# Patient Record
Sex: Female | Born: 1980 | Race: White | Hispanic: No | Marital: Single | Smoking: Current every day smoker
Health system: Southern US, Community
[De-identification: ages and names within clinical notes are randomized; demographics above are authoritative.]

## PROBLEM LIST (undated history)

## (undated) ENCOUNTER — Inpatient Hospital Stay (HOSPITAL_COMMUNITY): Payer: Self-pay

## (undated) DIAGNOSIS — G935 Compression of brain: Secondary | ICD-10-CM

## (undated) DIAGNOSIS — J45909 Unspecified asthma, uncomplicated: Secondary | ICD-10-CM

## (undated) DIAGNOSIS — J302 Other seasonal allergic rhinitis: Secondary | ICD-10-CM

## (undated) DIAGNOSIS — A609 Anogenital herpesviral infection, unspecified: Secondary | ICD-10-CM

## (undated) DIAGNOSIS — F419 Anxiety disorder, unspecified: Secondary | ICD-10-CM

## (undated) DIAGNOSIS — R002 Palpitations: Secondary | ICD-10-CM

## (undated) DIAGNOSIS — G8929 Other chronic pain: Secondary | ICD-10-CM

## (undated) DIAGNOSIS — R87612 Low grade squamous intraepithelial lesion on cytologic smear of cervix (LGSIL): Secondary | ICD-10-CM

## (undated) DIAGNOSIS — M545 Low back pain, unspecified: Secondary | ICD-10-CM

## (undated) DIAGNOSIS — M1990 Unspecified osteoarthritis, unspecified site: Secondary | ICD-10-CM

## (undated) DIAGNOSIS — G629 Polyneuropathy, unspecified: Secondary | ICD-10-CM

## (undated) HISTORY — DX: Compression of brain: G93.5

## (undated) HISTORY — DX: Anogenital herpesviral infection, unspecified: A60.9

## (undated) HISTORY — PX: WISDOM TOOTH EXTRACTION: SHX21

## (undated) HISTORY — DX: Low grade squamous intraepithelial lesion on cytologic smear of cervix (LGSIL): R87.612

## (undated) HISTORY — PX: HERNIA REPAIR: SHX51

## (undated) HISTORY — PX: WRIST GANGLION EXCISION: SUR520

## (undated) HISTORY — DX: Palpitations: R00.2

---
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
## 1998-11-08 ENCOUNTER — Other Ambulatory Visit: Admission: RE | Admit: 1998-11-08 | Discharge: 1998-11-08 | Payer: Self-pay | Admitting: Gynecology

## 1999-11-08 ENCOUNTER — Other Ambulatory Visit: Admission: RE | Admit: 1999-11-08 | Discharge: 1999-11-08 | Payer: Self-pay | Admitting: Gynecology

## 1999-12-21 ENCOUNTER — Encounter (INDEPENDENT_AMBULATORY_CARE_PROVIDER_SITE_OTHER): Payer: Self-pay

## 1999-12-21 ENCOUNTER — Other Ambulatory Visit: Admission: RE | Admit: 1999-12-21 | Discharge: 1999-12-21 | Payer: Self-pay | Admitting: Gynecology

## 2000-07-29 ENCOUNTER — Other Ambulatory Visit: Admission: RE | Admit: 2000-07-29 | Discharge: 2000-07-29 | Payer: Self-pay | Admitting: Obstetrics and Gynecology

## 2000-12-21 ENCOUNTER — Inpatient Hospital Stay (HOSPITAL_COMMUNITY): Admission: AD | Admit: 2000-12-21 | Discharge: 2000-12-21 | Payer: Self-pay | Admitting: Gynecology

## 2001-07-02 ENCOUNTER — Other Ambulatory Visit: Admission: RE | Admit: 2001-07-02 | Discharge: 2001-07-02 | Payer: Self-pay | Admitting: Gynecology

## 2001-12-15 ENCOUNTER — Other Ambulatory Visit: Admission: RE | Admit: 2001-12-15 | Discharge: 2001-12-15 | Payer: Self-pay | Admitting: Gynecology

## 2003-04-01 ENCOUNTER — Emergency Department (HOSPITAL_COMMUNITY): Admission: EM | Admit: 2003-04-01 | Discharge: 2003-04-01 | Payer: Self-pay

## 2003-04-09 ENCOUNTER — Emergency Department (HOSPITAL_COMMUNITY): Admission: AD | Admit: 2003-04-09 | Discharge: 2003-04-09 | Payer: Self-pay | Admitting: Family Medicine

## 2004-07-25 ENCOUNTER — Emergency Department (HOSPITAL_COMMUNITY): Admission: EM | Admit: 2004-07-25 | Discharge: 2004-07-25 | Payer: Self-pay | Admitting: Emergency Medicine

## 2004-11-13 ENCOUNTER — Inpatient Hospital Stay (HOSPITAL_COMMUNITY): Admission: AD | Admit: 2004-11-13 | Discharge: 2004-11-13 | Payer: Self-pay | Admitting: Obstetrics and Gynecology

## 2004-11-14 ENCOUNTER — Emergency Department (HOSPITAL_COMMUNITY): Admission: EM | Admit: 2004-11-14 | Discharge: 2004-11-14 | Payer: Self-pay | Admitting: Family Medicine

## 2005-06-22 ENCOUNTER — Emergency Department (HOSPITAL_COMMUNITY): Admission: EM | Admit: 2005-06-22 | Discharge: 2005-06-22 | Payer: Self-pay | Admitting: Family Medicine

## 2005-08-20 ENCOUNTER — Emergency Department (HOSPITAL_COMMUNITY): Admission: EM | Admit: 2005-08-20 | Discharge: 2005-08-20 | Payer: Self-pay | Admitting: Family Medicine

## 2005-09-20 ENCOUNTER — Ambulatory Visit (HOSPITAL_COMMUNITY): Admission: RE | Admit: 2005-09-20 | Discharge: 2005-09-20 | Payer: Self-pay | Admitting: Orthopedic Surgery

## 2006-04-05 ENCOUNTER — Inpatient Hospital Stay (HOSPITAL_COMMUNITY): Admission: AD | Admit: 2006-04-05 | Discharge: 2006-04-05 | Payer: Self-pay | Admitting: Obstetrics

## 2006-07-11 ENCOUNTER — Emergency Department (HOSPITAL_COMMUNITY): Admission: EM | Admit: 2006-07-11 | Discharge: 2006-07-11 | Payer: Self-pay | Admitting: Emergency Medicine

## 2006-12-08 ENCOUNTER — Emergency Department (HOSPITAL_COMMUNITY): Admission: EM | Admit: 2006-12-08 | Discharge: 2006-12-09 | Payer: Self-pay | Admitting: Emergency Medicine

## 2006-12-08 ENCOUNTER — Encounter: Admission: RE | Admit: 2006-12-08 | Discharge: 2006-12-08 | Payer: Self-pay | Admitting: Orthopaedic Surgery

## 2006-12-24 ENCOUNTER — Emergency Department (HOSPITAL_COMMUNITY): Admission: EM | Admit: 2006-12-24 | Discharge: 2006-12-24 | Payer: Self-pay | Admitting: Emergency Medicine

## 2007-01-28 ENCOUNTER — Encounter: Admission: RE | Admit: 2007-01-28 | Discharge: 2007-01-28 | Payer: Self-pay | Admitting: Orthopaedic Surgery

## 2007-03-25 ENCOUNTER — Encounter
Admission: RE | Admit: 2007-03-25 | Discharge: 2007-04-24 | Payer: Self-pay | Admitting: Physical Medicine & Rehabilitation

## 2007-03-25 ENCOUNTER — Ambulatory Visit: Payer: Self-pay | Admitting: Physical Medicine & Rehabilitation

## 2007-05-01 ENCOUNTER — Ambulatory Visit (HOSPITAL_COMMUNITY): Admission: RE | Admit: 2007-05-01 | Discharge: 2007-05-01 | Payer: Self-pay | Admitting: Obstetrics

## 2007-07-01 ENCOUNTER — Encounter
Admission: RE | Admit: 2007-07-01 | Discharge: 2007-09-29 | Payer: Self-pay | Admitting: Physical Medicine & Rehabilitation

## 2007-07-03 ENCOUNTER — Ambulatory Visit: Payer: Self-pay | Admitting: Physical Medicine & Rehabilitation

## 2007-08-31 ENCOUNTER — Ambulatory Visit: Payer: Self-pay | Admitting: Physical Medicine & Rehabilitation

## 2007-12-04 ENCOUNTER — Encounter
Admission: RE | Admit: 2007-12-04 | Discharge: 2007-12-07 | Payer: Self-pay | Admitting: Physical Medicine & Rehabilitation

## 2007-12-07 ENCOUNTER — Ambulatory Visit: Payer: Self-pay | Admitting: Physical Medicine & Rehabilitation

## 2008-02-29 ENCOUNTER — Encounter
Admission: RE | Admit: 2008-02-29 | Discharge: 2008-02-29 | Payer: Self-pay | Admitting: Physical Medicine & Rehabilitation

## 2008-02-29 ENCOUNTER — Ambulatory Visit: Payer: Self-pay | Admitting: Physical Medicine & Rehabilitation

## 2008-03-24 ENCOUNTER — Inpatient Hospital Stay (HOSPITAL_COMMUNITY): Admission: AD | Admit: 2008-03-24 | Discharge: 2008-03-24 | Payer: Self-pay | Admitting: Obstetrics & Gynecology

## 2008-03-26 ENCOUNTER — Inpatient Hospital Stay (HOSPITAL_COMMUNITY): Admission: AD | Admit: 2008-03-26 | Discharge: 2008-03-26 | Payer: Self-pay | Admitting: Obstetrics & Gynecology

## 2008-04-04 ENCOUNTER — Inpatient Hospital Stay (HOSPITAL_COMMUNITY): Admission: RE | Admit: 2008-04-04 | Discharge: 2008-04-04 | Payer: Self-pay | Admitting: Obstetrics & Gynecology

## 2008-05-17 IMAGING — US US TRANSVAGINAL NON-OB
1 series · 14 of 25 positions shown · non-contrast
Comparison: none

CLINICAL DATA: Right pelvic pain.
 TRANSABDOMINAL AND TRANSVAGINAL PELVIC ULTRASOUND:
TECHNIQUE: Both transabdominal and transvaginal ultrasound examinations of the pelvis were performed including evaluation of the uterus, ovaries, adnexal regions, and pelvic cul-de-sac.

[Series 1: us transvaginal non-ob · 0.25mm/px · 14 of 43 slices shown]
[im 1/43]
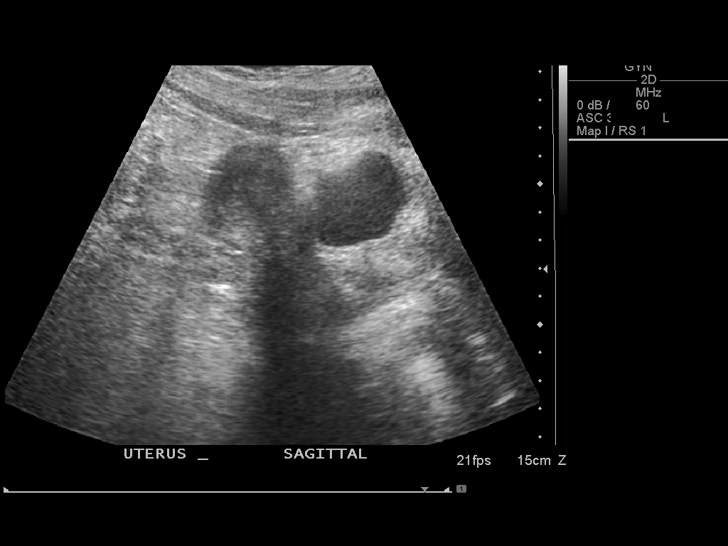
[im 4/43]
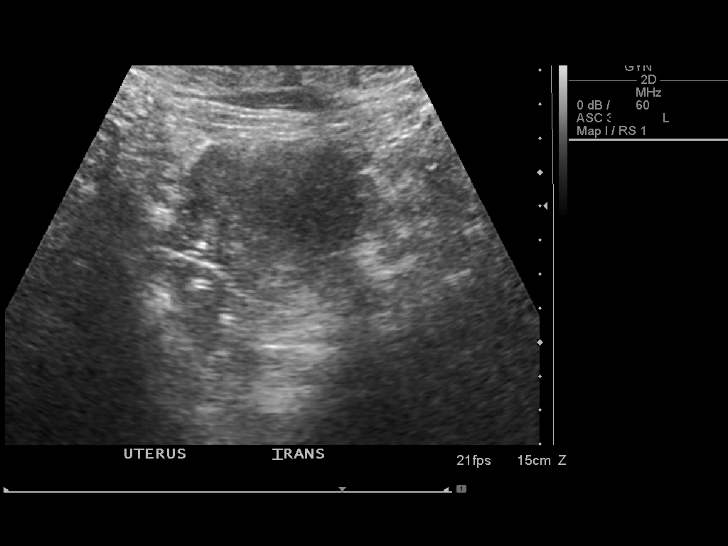
[im 8/43]
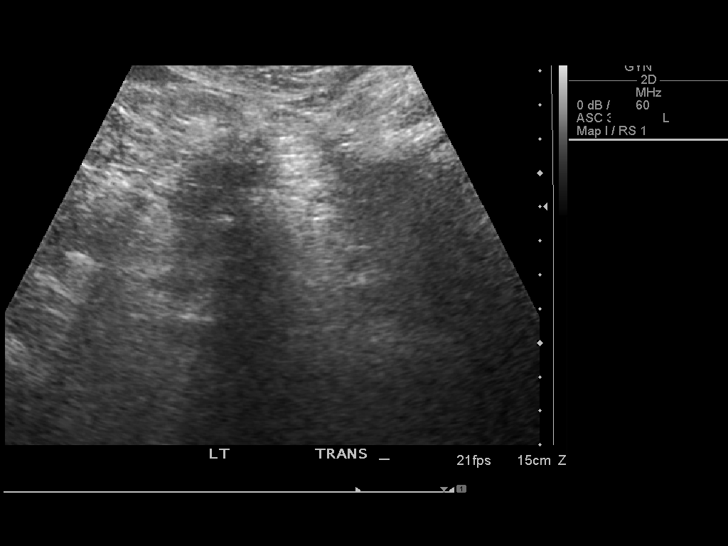
[im 11/43]
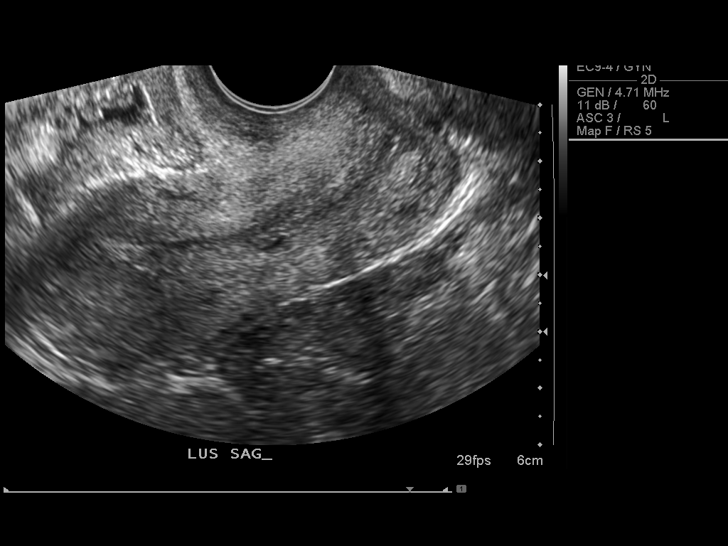
[im 15/43]
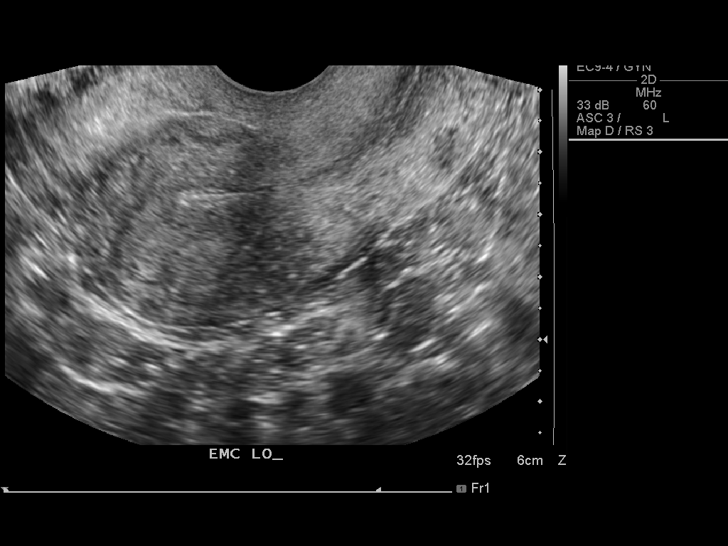
[im 16/43]
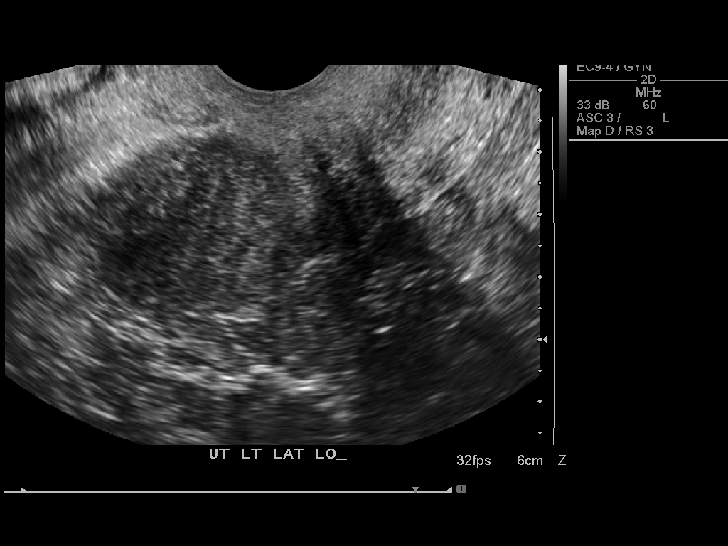
[im 20/43]
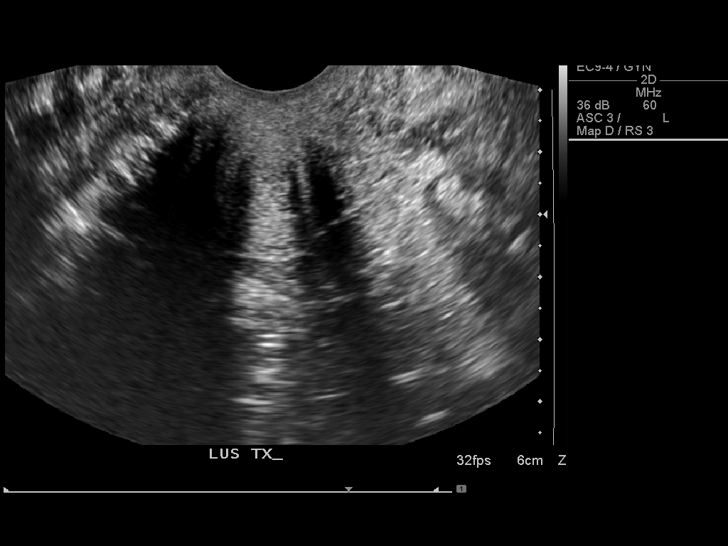
[im 23/43]
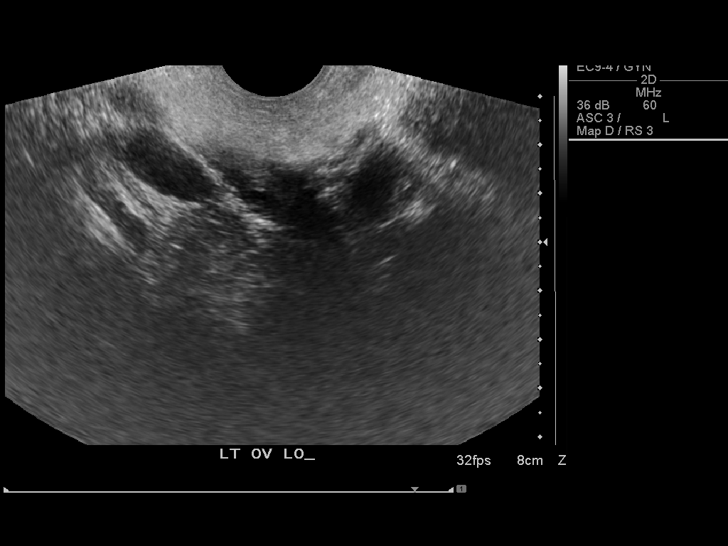
[im 27/43]
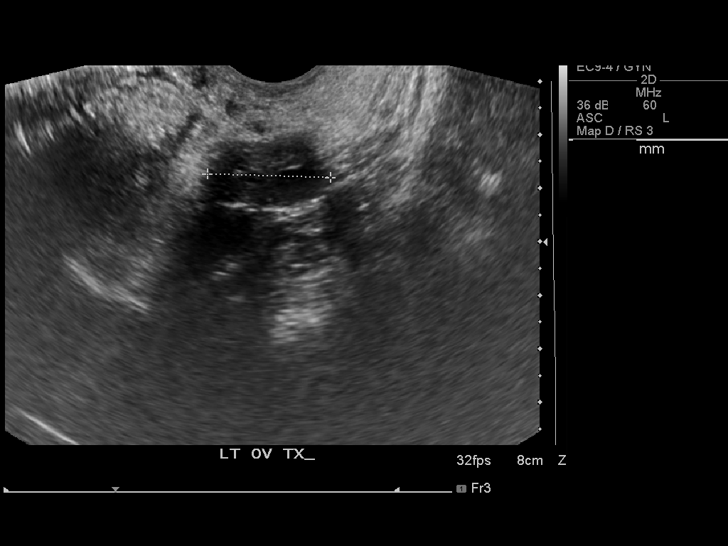
[im 29/43]
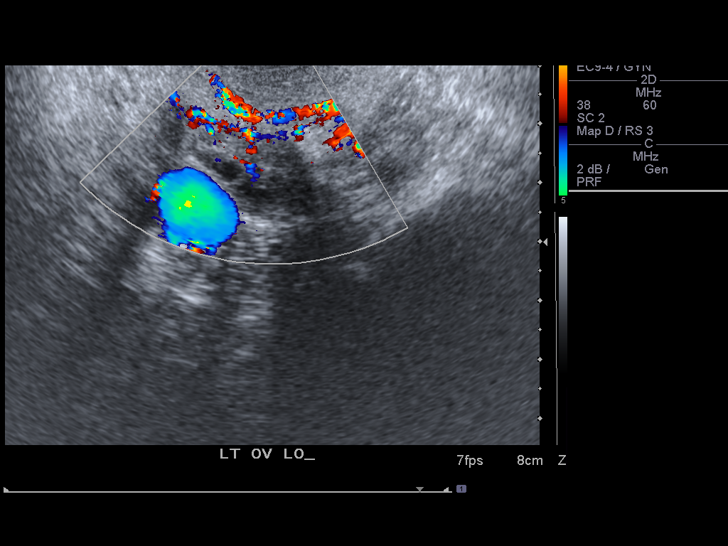
[im 32/43]
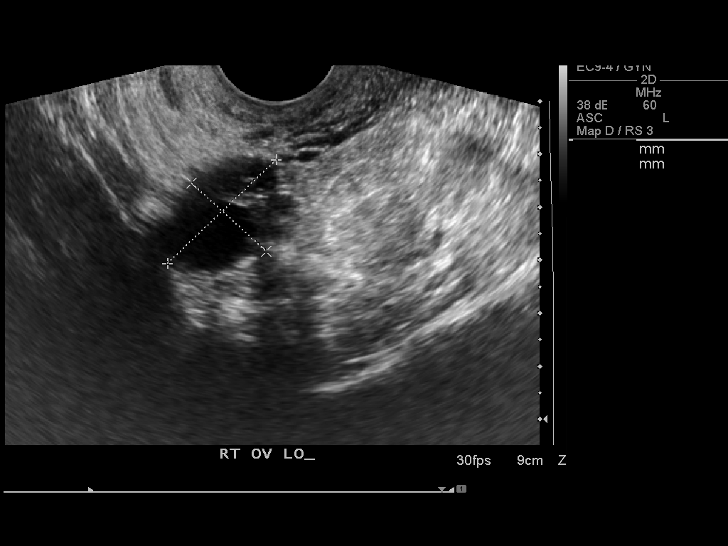
[im 36/43]
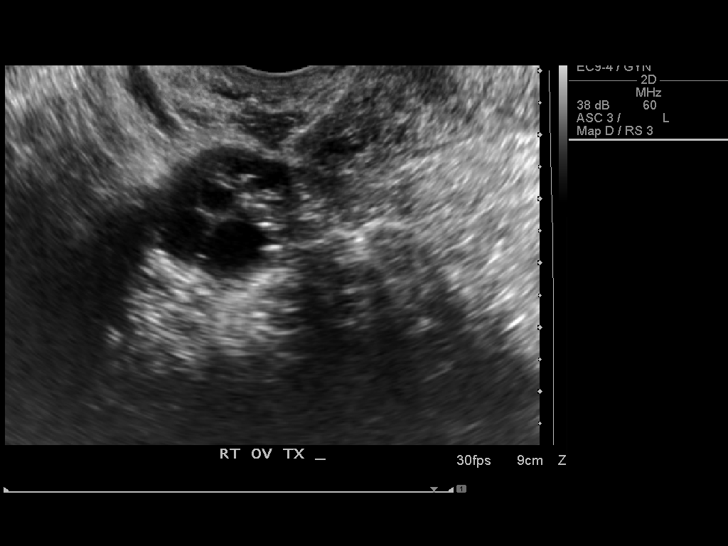
[im 39/43]
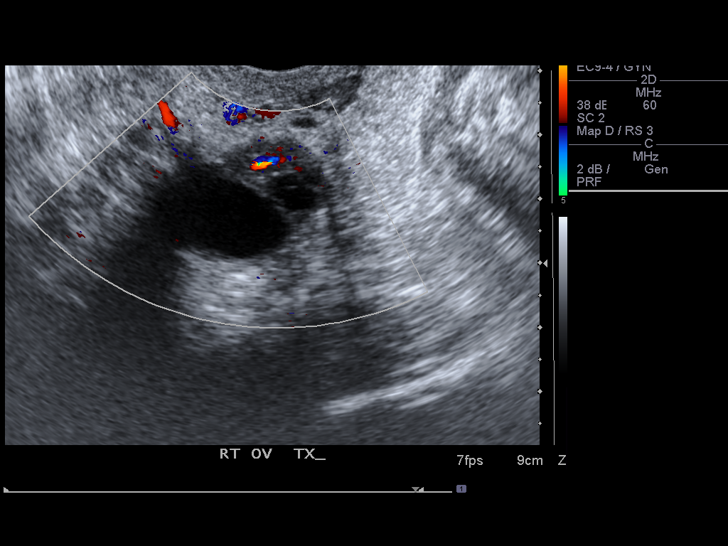
[im 43/43]
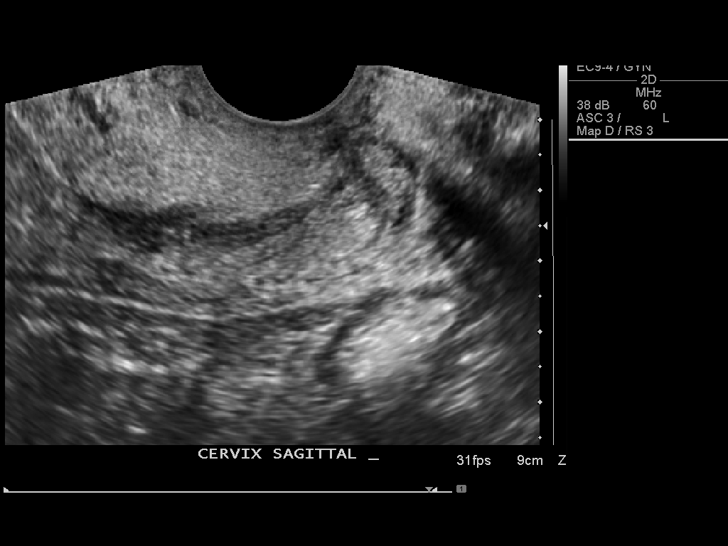

[14 of 25 positions shown; findings below may reference images not displayed]

FINDINGS: The uterus is within normal limits in size and appearance.  No uterine masses are identified.  The endometrium is within normal limits in thickness.
 Both ovaries are normal in appearance, and no masses or abnormal cystic lesions are seen.  No other adnexal abnormalities are identified, and there is no evidence of free fluid.
IMPRESSION: Normal study.  No evidence of pelvic mass or free fluid.

## 2008-06-13 ENCOUNTER — Encounter: Admission: RE | Admit: 2008-06-13 | Discharge: 2008-09-11 | Payer: Self-pay | Admitting: Anesthesiology

## 2008-06-27 ENCOUNTER — Ambulatory Visit (HOSPITAL_COMMUNITY): Admission: RE | Admit: 2008-06-27 | Discharge: 2008-06-27 | Payer: Self-pay | Admitting: Family Medicine

## 2008-08-11 ENCOUNTER — Ambulatory Visit: Payer: Self-pay | Admitting: Obstetrics & Gynecology

## 2008-08-11 ENCOUNTER — Encounter: Payer: Self-pay | Admitting: Family

## 2008-08-12 ENCOUNTER — Encounter: Payer: Self-pay | Admitting: Family

## 2008-08-12 LAB — CONVERTED CEMR LAB
Clue Cells Wet Prep HPF POC: NONE SEEN
Trich, Wet Prep: NONE SEEN
WBC, Wet Prep HPF POC: NONE SEEN
Yeast Wet Prep HPF POC: NONE SEEN

## 2008-08-25 ENCOUNTER — Ambulatory Visit: Payer: Self-pay | Admitting: Obstetrics & Gynecology

## 2008-09-08 ENCOUNTER — Ambulatory Visit: Payer: Self-pay | Admitting: Obstetrics & Gynecology

## 2008-09-08 LAB — CONVERTED CEMR LAB
Trich, Wet Prep: NONE SEEN
Yeast Wet Prep HPF POC: NONE SEEN

## 2008-09-22 ENCOUNTER — Ambulatory Visit: Payer: Self-pay | Admitting: Obstetrics & Gynecology

## 2008-09-22 LAB — CONVERTED CEMR LAB
HCT: 34.7 % — ABNORMAL LOW (ref 36.0–46.0)
Hemoglobin: 11.1 g/dL — ABNORMAL LOW (ref 12.0–15.0)
MCHC: 32 g/dL (ref 30.0–36.0)
MCV: 86.3 fL (ref 78.0–100.0)
Platelets: 304 10*3/uL (ref 150–400)
RDW: 14.6 % (ref 11.5–15.5)

## 2008-10-06 ENCOUNTER — Ambulatory Visit: Payer: Self-pay | Admitting: Obstetrics & Gynecology

## 2008-10-20 ENCOUNTER — Ambulatory Visit: Payer: Self-pay | Admitting: Obstetrics & Gynecology

## 2008-11-03 ENCOUNTER — Ambulatory Visit: Payer: Self-pay | Admitting: Obstetrics & Gynecology

## 2008-11-03 LAB — CONVERTED CEMR LAB
Chlamydia, DNA Probe: NEGATIVE
GC Probe Amp, Genital: NEGATIVE

## 2008-11-04 ENCOUNTER — Encounter: Payer: Self-pay | Admitting: Obstetrics & Gynecology

## 2008-11-04 LAB — CONVERTED CEMR LAB: Yeast Wet Prep HPF POC: NONE SEEN

## 2008-11-10 ENCOUNTER — Ambulatory Visit: Payer: Self-pay | Admitting: Family Medicine

## 2008-11-17 ENCOUNTER — Ambulatory Visit: Payer: Self-pay | Admitting: Obstetrics & Gynecology

## 2008-11-24 ENCOUNTER — Ambulatory Visit: Payer: Self-pay | Admitting: Obstetrics & Gynecology

## 2008-11-30 ENCOUNTER — Inpatient Hospital Stay (HOSPITAL_COMMUNITY): Admission: AD | Admit: 2008-11-30 | Discharge: 2008-12-01 | Payer: Self-pay | Admitting: Obstetrics & Gynecology

## 2008-11-30 ENCOUNTER — Ambulatory Visit: Payer: Self-pay | Admitting: Physician Assistant

## 2008-12-01 ENCOUNTER — Ambulatory Visit: Payer: Self-pay | Admitting: Obstetrics & Gynecology

## 2008-12-03 ENCOUNTER — Inpatient Hospital Stay (HOSPITAL_COMMUNITY): Admission: AD | Admit: 2008-12-03 | Discharge: 2008-12-05 | Payer: Self-pay | Admitting: Obstetrics & Gynecology

## 2008-12-03 ENCOUNTER — Ambulatory Visit: Payer: Self-pay | Admitting: Advanced Practice Midwife

## 2009-04-21 IMAGING — US US OB TRANSVAGINAL
1 series · 14 of 21 positions shown · non-contrast
Comparison: none

OBSTETRICAL ULTRASOUND:
 This ultrasound exam was performed in the [HOSPITAL] Ultrasound Department.  The OB US report was generated in the AS system, and faxed to the ordering physician.  This report is also available in [REDACTED] PACS.

[Series 1: us ob transvaginal · 14 of 21 slices shown]
[im 1/21]
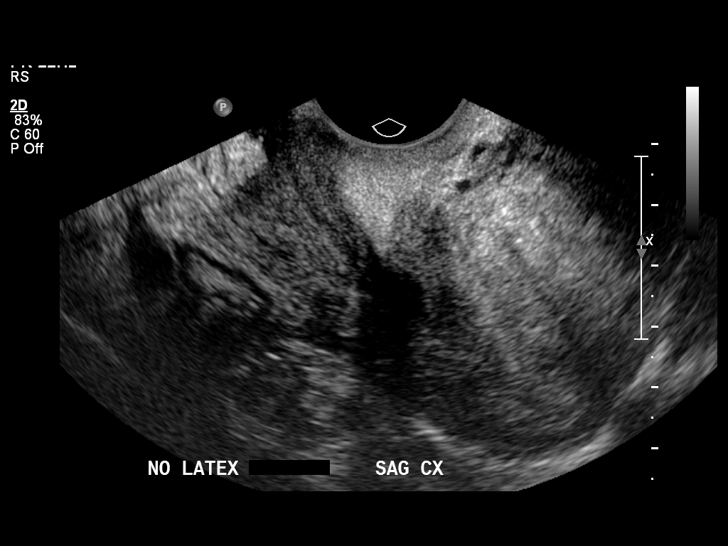
[im 3/21]
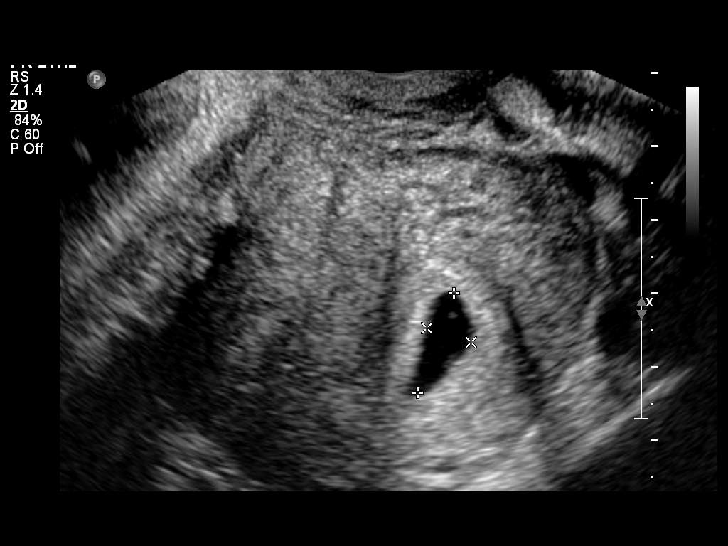
[im 4/21]
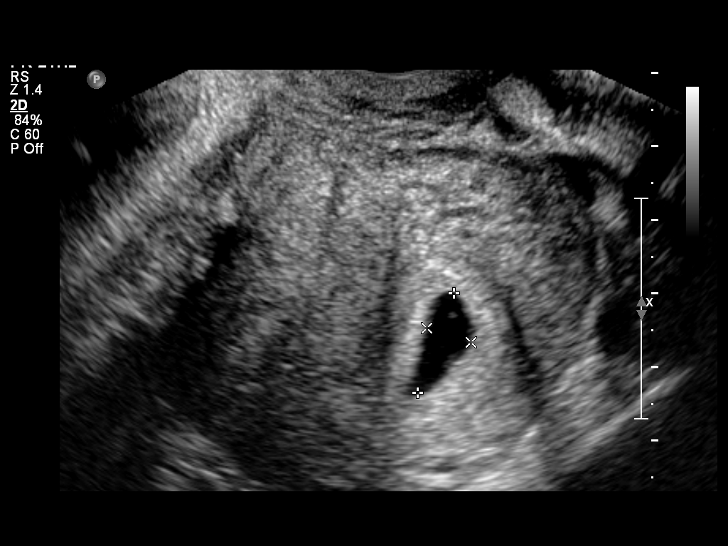
[im 6/21]
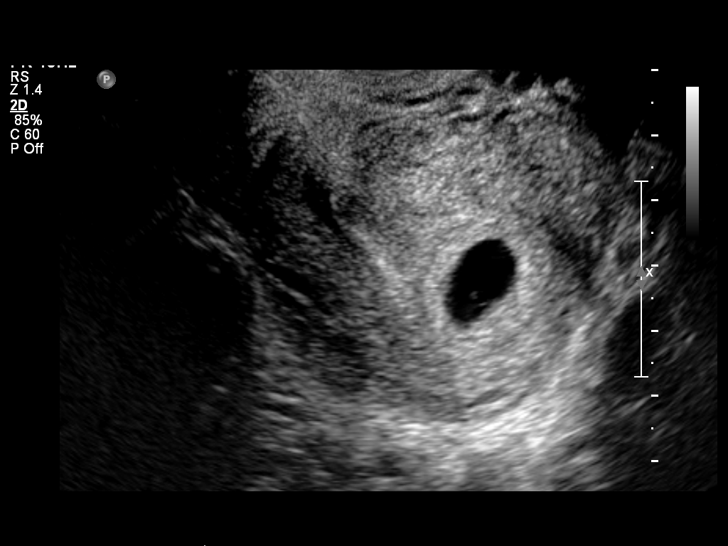
[im 7/21]
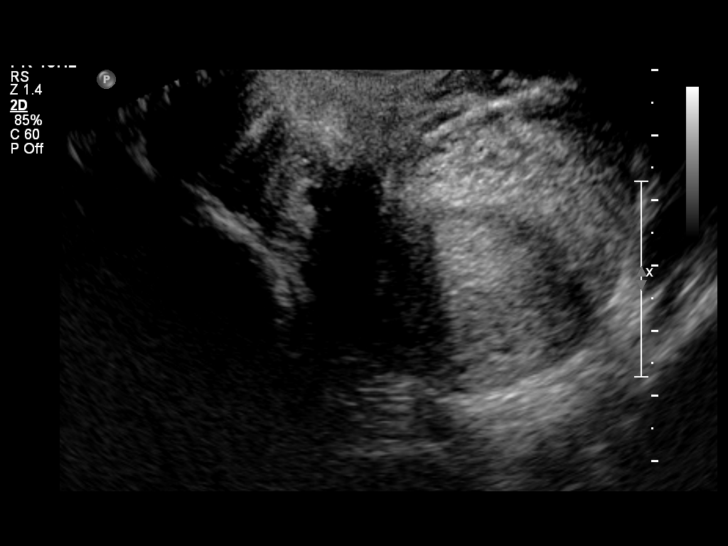
[im 9/21]
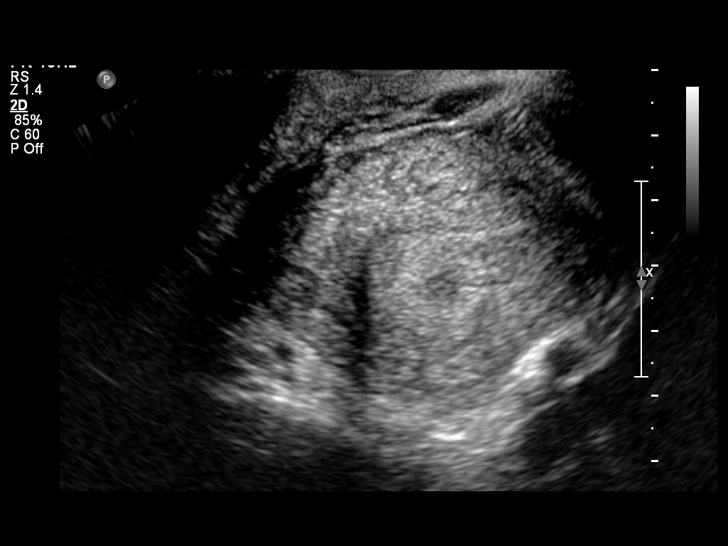
[im 10/21]
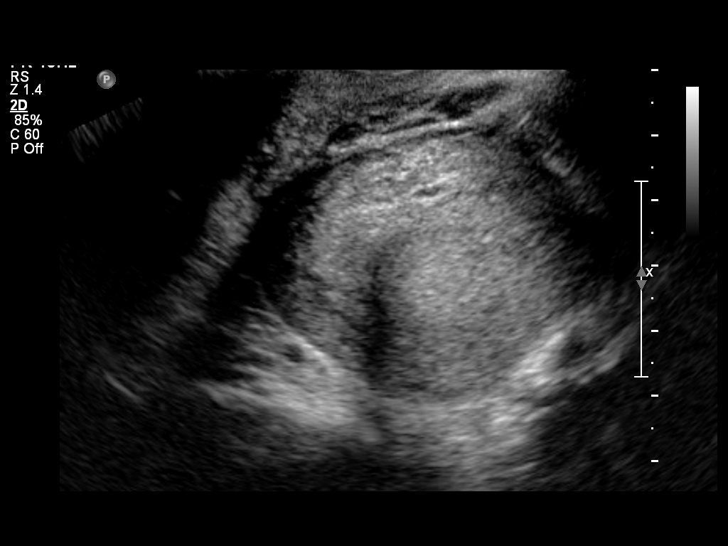
[im 12/21]
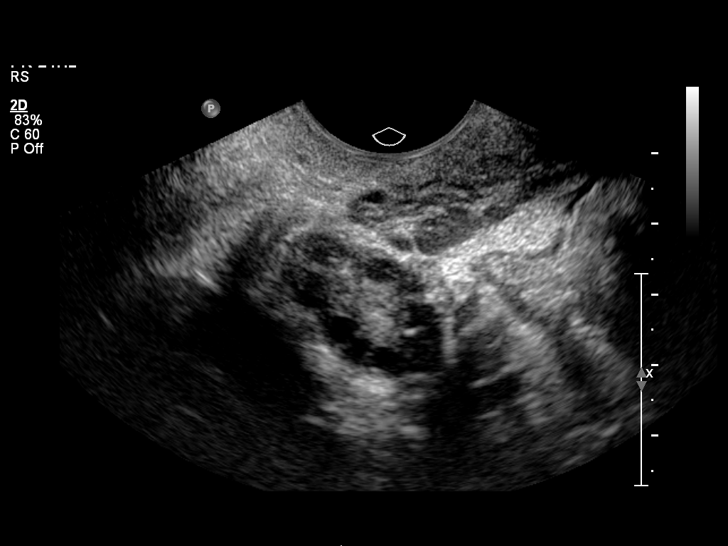
[im 13/21]
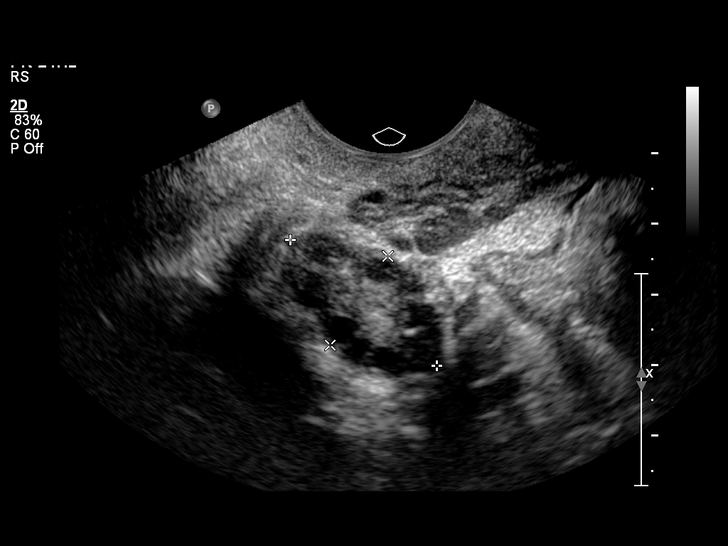
[im 15/21]
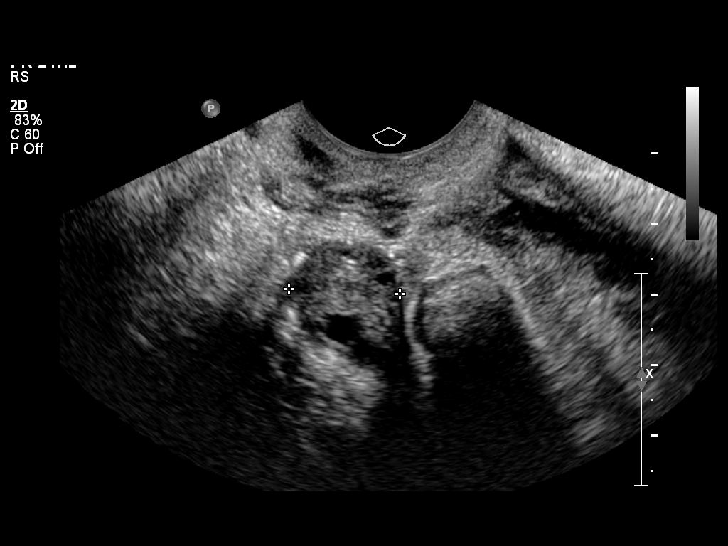
[im 16/21]
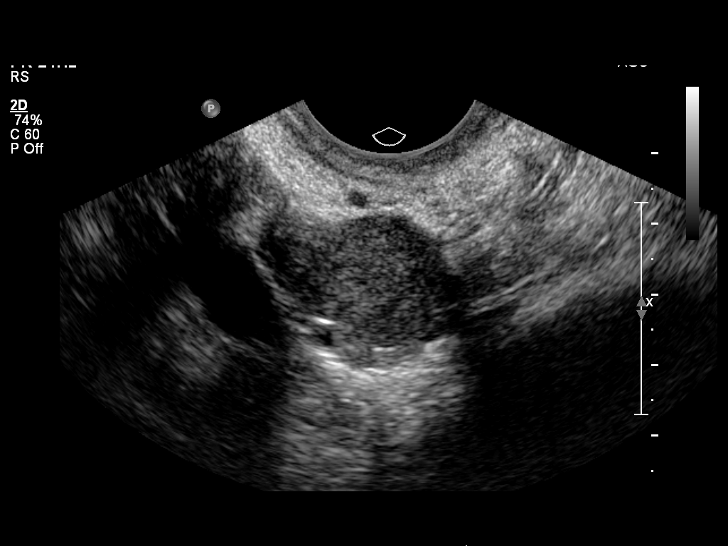
[im 18/21]
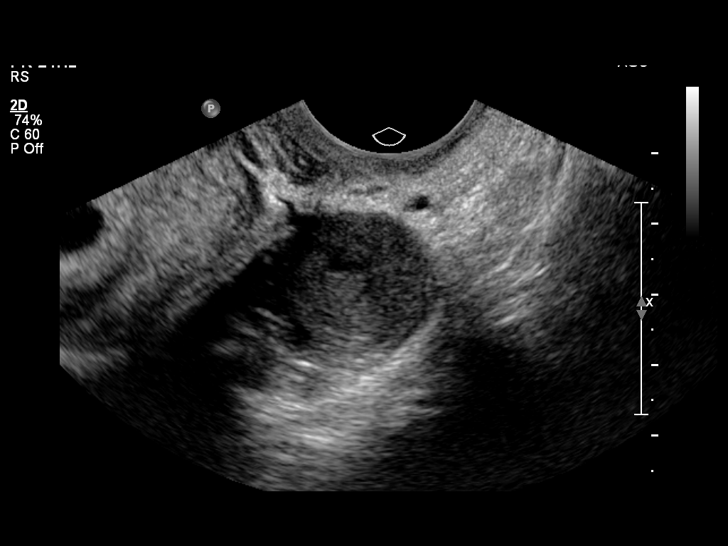
[im 19/21]
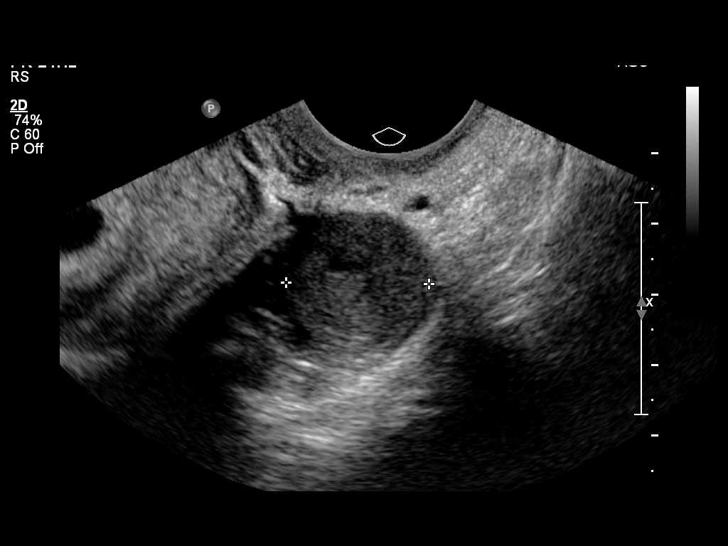
[im 21/21]
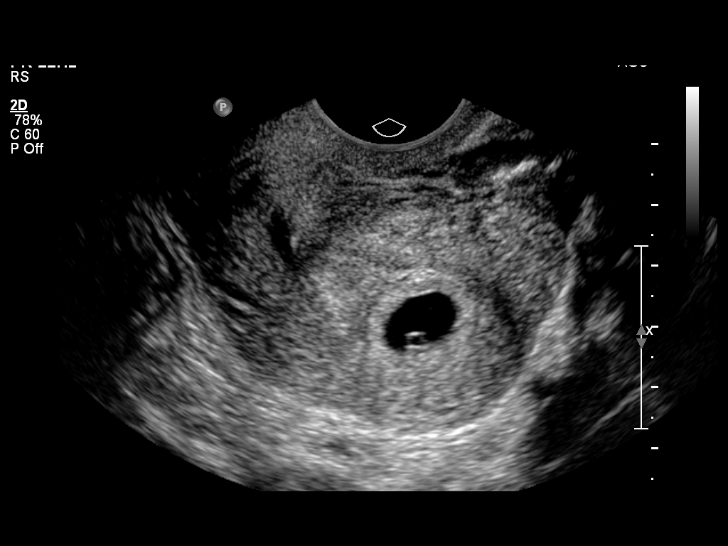

[14 of 21 positions shown; findings below may reference images not displayed]

IMPRESSION: See [facility] Obstetric US report.

## 2010-06-15 LAB — POCT URINALYSIS DIP (DEVICE)
Bilirubin Urine: NEGATIVE
Bilirubin Urine: NEGATIVE
Glucose, UA: NEGATIVE mg/dL
Glucose, UA: NEGATIVE mg/dL
Glucose, UA: NEGATIVE mg/dL
Hgb urine dipstick: NEGATIVE
Ketones, ur: NEGATIVE mg/dL
Ketones, ur: NEGATIVE mg/dL
Ketones, ur: NEGATIVE mg/dL
Ketones, ur: NEGATIVE mg/dL
Nitrite: NEGATIVE
Protein, ur: NEGATIVE mg/dL
Specific Gravity, Urine: 1.015 (ref 1.005–1.030)
Specific Gravity, Urine: 1.015 (ref 1.005–1.030)
Urobilinogen, UA: 0.2 mg/dL (ref 0.0–1.0)
pH: 7 (ref 5.0–8.0)

## 2010-06-15 LAB — CBC
HCT: 35.5 % — ABNORMAL LOW (ref 36.0–46.0)
Platelets: 311 10*3/uL (ref 150–400)
RBC: 4.12 MIL/uL (ref 3.87–5.11)
WBC: 11.2 10*3/uL — ABNORMAL HIGH (ref 4.0–10.5)

## 2010-06-15 LAB — RPR: RPR Ser Ql: NONREACTIVE

## 2010-06-16 LAB — POCT URINALYSIS DIP (DEVICE)
Bilirubin Urine: NEGATIVE
Bilirubin Urine: NEGATIVE
Glucose, UA: NEGATIVE mg/dL
Hgb urine dipstick: NEGATIVE
Ketones, ur: NEGATIVE mg/dL
Ketones, ur: NEGATIVE mg/dL
Specific Gravity, Urine: 1.015 (ref 1.005–1.030)
Specific Gravity, Urine: 1.01 (ref 1.005–1.030)
pH: 7 (ref 5.0–8.0)

## 2010-06-17 LAB — POCT URINALYSIS DIP (DEVICE)
Bilirubin Urine: NEGATIVE
Glucose, UA: NEGATIVE mg/dL
Hgb urine dipstick: NEGATIVE
Hgb urine dipstick: NEGATIVE
Ketones, ur: NEGATIVE mg/dL
Nitrite: NEGATIVE
Protein, ur: NEGATIVE mg/dL
Specific Gravity, Urine: 1.02 (ref 1.005–1.030)
Specific Gravity, Urine: 1.02 (ref 1.005–1.030)
Urobilinogen, UA: 0.2 mg/dL (ref 0.0–1.0)
pH: 6 (ref 5.0–8.0)
pH: 6.5 (ref 5.0–8.0)

## 2010-06-18 LAB — POCT URINALYSIS DIP (DEVICE)
Ketones, ur: NEGATIVE mg/dL
Protein, ur: NEGATIVE mg/dL
Protein, ur: NEGATIVE mg/dL
Specific Gravity, Urine: 1.015 (ref 1.005–1.030)
Specific Gravity, Urine: 1.02 (ref 1.005–1.030)
Urobilinogen, UA: 0.2 mg/dL (ref 0.0–1.0)
pH: 7 (ref 5.0–8.0)
pH: 6.5 (ref 5.0–8.0)

## 2010-06-25 LAB — URINALYSIS, ROUTINE W REFLEX MICROSCOPIC
Glucose, UA: NEGATIVE mg/dL
Ketones, ur: NEGATIVE mg/dL
Protein, ur: NEGATIVE mg/dL

## 2010-06-25 LAB — URINE MICROSCOPIC-ADD ON

## 2010-06-25 LAB — HCG, QUANTITATIVE, PREGNANCY: hCG, Beta Chain, Quant, S: 615 m[IU]/mL — ABNORMAL HIGH (ref <5)

## 2010-06-25 LAB — WET PREP, GENITAL
Trich, Wet Prep: NONE SEEN
Yeast Wet Prep HPF POC: NONE SEEN

## 2010-06-25 LAB — CBC
Hemoglobin: 12.5 g/dL (ref 12.0–15.0)
MCHC: 32.8 g/dL (ref 30.0–36.0)
RBC: 4.44 MIL/uL (ref 3.87–5.11)

## 2010-06-25 LAB — GC/CHLAMYDIA PROBE AMP, GENITAL: GC Probe Amp, Genital: NEGATIVE

## 2010-06-25 LAB — ABO/RH: ABO/RH(D): A POS

## 2010-07-24 NOTE — Assessment & Plan Note (Signed)
Monique Wells returns to clinic today for follow up evaluation.  During the  last clinic visit on April 24, 2007, we had increased her oxycodone  strength to a 10/325 from a 7.5/325.  She reports that she was getting  some benefit for the first 4-6 weeks, but now feels that the medicine is  either not giving her as much relief or her pain condition has worsened.  In any event she is not getting pain for an extended period of time.  She is not interested in using a Duragesic patch and she reports an  allergy to morphine and Vicodin.  We have talked with her in the office  today about possibly using OxyContin medication.  I have gone over the  restrictions and warning signs regarding that medication.  She is  interested in at least trying it at this time.   The patient reports poor sleep secondary to pain at the present time.  She also reports that she has restarted phentermine for weight loss,  along with multivitamin.   MEDICATIONS:  1. Percocet 10/325 one tablet q.i.d. p.r.n.  2. Tizanidine 4 mg 1/2 tablet to 1 tablet t.i.d. p.r.n.  3. Birth control pills daily.  4. Phentermine daily.  5. Multivitamin daily.   REVIEW OF SYSTEMS:  Positive for shortness of breath and wheezing.   PHYSICAL EXAMINATION:  Well-appearing young adult female in mild to  moderate acute discomfort.  Blood pressure 130/76 with a pulse of 126 and respiratory rate 24 and O2  saturation 97% on room air.  She has 4/5 strength throughout the bilateral upper and lower  extremities.  She ambulates without any assistance device.   IMPRESSION:  Chronic low back pain with minimal objective findings per  magnetic resonance imaging study.   In the office today, we did give the patient a new refill on her  Percocet as of Jul 16, 2007.  We also gave her a new script today for  OxyContin CR at 20 mg one tablet twice a day.  She has a few options at  this point with the reported allergies to morphine and Vicodin and  incomplete pain using the maximum strength of oxycodone.  We will see  how she does on the OxyContin in addition to her oxycodone immediate  release.  We will plan on seeing her in follow up in approximately 2  months time with refills prior to that appointment as necessary.           ______________________________  Ellwood Dense, M.D.     DC/MedQ  D:  07/03/2007 14:39:15  T:  07/03/2007 15:00:34  Job #:  [id number]

## 2010-07-24 NOTE — Assessment & Plan Note (Signed)
Monique Wells returns to the clinic today for followup evaluation.  I last and  first saw her in this office on March 26, 2007 on referral from Dr.  Thad Ranger.  At that time we decided to increase her Percocet from 5/325  to 7.5/325 and to be used four times a day as needed.  She reports that  she has been using it four times a day and gets relief for approximately  2-3 hours after each dose.   She does have an allergy to:  1. MORPHINE.  2. VICODIN.   She would like to adjust the Percocet slightly and continue on that  medication as she seems to tolerate it at the present time.  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   MEDICATIONS:  1. Percocet 7.5/325 one tablet q.i.d. p.r.n.  2. Tizanidine 4 mg one half tablet to one tablet t.i.d. p.r.n.  3. Birth control pills daily.   REVIEW OF SYSTEMS:  Positive for wheezing and shortness of breath.   PHYSICAL EXAMINATION:  GENERAL:  Well-appearing young adult female in  mild to no acute discomfort.  VITAL SIGNS:  Blood pressure 122/68 with a pulse of 90, respiratory rate  18, and O2 saturation 98% on room air.  MUSCULOSKELETAL:  She has 4 plus over 5 strength throughout the  bilateral upper and lower extremities.  She ambulates without any  assistive device.  Bulk and tone were normal throughout as was  sensation.   IMPRESSION:  Chronic low back pain with minimal objective findings per  magnetic resonance imaging study.   In the office today, we did increase the patient's oxycodone to 10/325  to be taken one tablet q.i.d. p.r.n., a total of 120 as of today  April 24, 2007.  We will plan on seeing the patient in followup in  approximately 2 months' time to see how she is doing on that medication.           ______________________________  Ellwood Dense, M.D.     DC/MedQ  D:  04/24/2007 14:01:15  T:  04/26/2007 14:59:34  Job #:  [id number]

## 2010-07-24 NOTE — Assessment & Plan Note (Signed)
Monique Wells returns to the clinic today for followup evaluation.  She  reports that she does not feel she is getting as much relief from the  OxyContin and Percocet as she had been previously.  She would like to  adjust the dose slightly.  She reports that the pain is consistent in  her low back and actually has woken her from her sleep on a periodic  basis.   MEDICATIONS:  1. Percocet 10/325 one tablet approximately 6 times per day.  2. OxyContin continuous release 20 mg b.i.d.  3. Multivitamin daily.   REVIEW OF SYSTEMS:  Positive for wheezing and shortness of breath.   PHYSICAL EXAMINATION:  GENERAL:  A well-appearing, young, adult female  in mild acute discomfort.  VITAL SIGNS:  Blood pressure 129/77 with a pulse of 93, respiratory rate  18, and O2 saturation 100% on room air.  She has 4+/5 strength  throughout the bilateral upper and lower extremities.  She ambulates  without any assistive device.   IMPRESSION:  Chronic low back pain with minimal objective findings per  MRA study.   In the office today, we did adjust the patient's OxyContin up to 30 mg  twice a day.  We gave her 10 mg tablet to be used with her OxyContin 20  mg that she has at present.  She will be starting at 30 mg tablet,  March 19, 2008.  We have also changed her from Percocet to  immediate release 15 mg to be used 1 tablet q.i.d. p.r.n.  We will plan  on seeing the patient and follow up in approximately 3 months' time.           ______________________________  Ellwood Dense, M.D.     DC/MedQ  D:  02/29/2008 13:23:29  T:  03/01/2008 03:42:29  Job #:  [id number]
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
## 2010-07-24 NOTE — Assessment & Plan Note (Signed)
HISTORY:  Ms. Hopkinson returned to the clinic today for followup evaluation.  We last saw her in this office on July 03, 2007.  We have had her using  OxyContin continuous release 20 mg twice a day along with p.r.n.  Percocet for her persistent back pain.  She reports that she has gained  some benefit with the addition of the OxyContin, which was in addition  during the last clinic visit.  She requests a refill on those two  medications along with a new refill on her Flexeril and ibuprofen, which  she has used in the past.  She has only used the tizanidine rarely and  is off the phentermine secondary to excessive cough.  She did fall at  her son's school a couple of weeks ago and bruised her back and hip.   MEDICATIONS:  1. Percocet 10/325 one tablet q.i.d. p.r.n.  2. Tizanidine 4 mg one-half tablet to one tablet t.i.d. p.r.n.  3. Birth control pills daily.  4. Multivitamins daily.  5. OxyContin continuous release 20 mg b.i.d.   REVIEW OF SYSTEMS:  Positive for wheezing and shortness of breath.   PHYSICAL EXAMINATION:  GENERAL:  Well-appearing, young adult female in  mild acute discomfort.  VITAL SIGNS:  Blood pressure 115/67 with a pulse of 94, respiratory rate  18, and O2 saturation 97% on room air.  EXTREMITIES:  She has 4/5 strength throughout the bilateral upper and  lower extremities.  She ambulates without any assistive device.   IMPRESSION:  Chronic low back pain with minimal objective findings per  MRI study.   PLAN:  In the office today, we did refill the patient's Percocet along  with her OxyContin at the appropriate dates.  We also gave her a new  prescription for Flexeril and ibuprofen.  She will be using those on a  pretty much daily basis.  We will plan on seeing her in followup in  approximately 3 months' time with refills prior to that appointment as  necessary.           ______________________________  Ellwood Dense, M.D.     DC/MedQ  D:  08/31/2007  14:57:00  T:  09/01/2007 04:22:55  Job #:  [id number]
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
## 2012-03-22 ENCOUNTER — Inpatient Hospital Stay (HOSPITAL_COMMUNITY): Payer: Medicaid Other

## 2012-03-22 ENCOUNTER — Inpatient Hospital Stay (HOSPITAL_COMMUNITY)
Admission: AD | Admit: 2012-03-22 | Discharge: 2012-03-22 | Disposition: A | Discharge status: Home / Self Care | Payer: Medicaid Other | Source: Ambulatory Visit | Attending: Obstetrics & Gynecology | Admitting: Obstetrics & Gynecology

## 2012-03-22 ENCOUNTER — Encounter (HOSPITAL_COMMUNITY): Payer: Self-pay | Admitting: Family

## 2012-03-22 DIAGNOSIS — R1084 Generalized abdominal pain: Secondary | ICD-10-CM

## 2012-03-22 DIAGNOSIS — N760 Acute vaginitis: Secondary | ICD-10-CM

## 2012-03-22 DIAGNOSIS — B9689 Other specified bacterial agents as the cause of diseases classified elsewhere: Secondary | ICD-10-CM | POA: Insufficient documentation

## 2012-03-22 DIAGNOSIS — O2390 Unspecified genitourinary tract infection in pregnancy, unspecified trimester: Secondary | ICD-10-CM | POA: Insufficient documentation

## 2012-03-22 DIAGNOSIS — Z3201 Encounter for pregnancy test, result positive: Secondary | ICD-10-CM

## 2012-03-22 DIAGNOSIS — A499 Bacterial infection, unspecified: Secondary | ICD-10-CM | POA: Insufficient documentation

## 2012-03-22 DIAGNOSIS — O30009 Twin pregnancy, unspecified number of placenta and unspecified number of amniotic sacs, unspecified trimester: Secondary | ICD-10-CM

## 2012-03-22 DIAGNOSIS — R109 Unspecified abdominal pain: Secondary | ICD-10-CM | POA: Insufficient documentation

## 2012-03-22 HISTORY — DX: Unspecified asthma, uncomplicated: J45.909

## 2012-03-22 LAB — WET PREP, GENITAL: Trich, Wet Prep: NONE SEEN

## 2012-03-22 LAB — URINALYSIS, ROUTINE W REFLEX MICROSCOPIC
Bilirubin Urine: NEGATIVE
Glucose, UA: NEGATIVE mg/dL
Hgb urine dipstick: NEGATIVE
Ketones, ur: NEGATIVE mg/dL
pH: 5.5 (ref 5.0–8.0)

## 2012-03-22 MED ORDER — METOCLOPRAMIDE HCL 10 MG PO TABS: 10.0000 mg | ORAL_TABLET | Freq: Four times a day (QID) | ORAL | Status: DC

## 2012-03-22 MED ORDER — METRONIDAZOLE 500 MG PO TABS: 500.0000 mg | ORAL_TABLET | Freq: Two times a day (BID) | ORAL | Status: DC

## 2012-03-22 MED ORDER — CLINDAMYCIN HCL 300 MG PO CAPS: 300.0000 mg | ORAL_CAPSULE | Freq: Two times a day (BID) | ORAL | Status: DC

## 2012-03-22 NOTE — MAU Note (Signed)
Pt had +HPT last week. Cramping and sharp pain started yesterday. Denies vag bleeding or discharge. Also c/o nausea for about 1.5 weeks.

## 2012-03-22 NOTE — MAU Note (Signed)
Patient presents to MAU with c/o abdominal cramping since 1200 yesterday with R lower abdominal pain awakened her in the middle of the night. Denies vaginal bleeding, LOF.  Had positive HPT last Saturday; Unsure of LMP; had regular period start on 10/22 and light bleeding on 11/26 for 3 days.

## 2012-03-22 NOTE — MAU Provider Note (Signed)
History     CSN: [id number]  Arrival date and time: 03/22/12 1450   None     Chief Complaint  Patient presents with  . Abdominal Cramping   HPI 32 y.o. [id number] at [redacted]w[redacted]d with sharp right sided pain and cramping that extends across low abdomen, no bleeding or discharge. H/O SAB x 1, TAB x1, no ectopic. Unsure LMP.   Past Medical History  Diagnosis Date  . Asthma   . Headache     Past Surgical History  Procedure Date  . Wrist ganglion excision     History reviewed. No pertinent family history.  History  Substance Use Topics  . Smoking status: Current Every Day Smoker -- 1.0 packs/day  . Smokeless tobacco: Never Used  . Alcohol Use: No    Allergies:  Allergies  Allergen Reactions  . Morphine And Related Anaphylaxis and Hives  . Vicodin (Hydrocodone-Acetaminophen) Itching    Prescriptions prior to admission  Medication Sig Dispense Refill  . acetaminophen (TYLENOL) 325 MG tablet Take 650 mg by mouth every 6 (six) hours as needed. pain      . albuterol (PROVENTIL HFA;VENTOLIN HFA) 108 (90 BASE) MCG/ACT inhaler Inhale 2 puffs into the lungs every 6 (six) hours as needed. Asthma; exercise induced        Review of Systems  Constitutional: Negative.   Respiratory: Negative.   Cardiovascular: Negative.   Gastrointestinal: Positive for nausea, vomiting and abdominal pain. Negative for diarrhea and constipation.  Genitourinary: Negative for dysuria, urgency, frequency, hematuria and flank pain.       Negative for vaginal bleeding, vaginal discharge  Musculoskeletal: Negative.   Neurological: Negative.   Psychiatric/Behavioral: Negative.    Physical Exam   Blood pressure 135/69, pulse 92, temperature 98.1 F (36.7 C), temperature source Oral, resp. rate 18, height 5\' 5"  (1.651 m), weight 207 lb 9.6 oz (94.167 kg), last menstrual period 02/04/2012.  Physical Exam  Nursing note and vitals reviewed. Constitutional: She is oriented to person, place, and time. She  appears well-developed and well-nourished. No distress.  HENT:  Head: Normocephalic and atraumatic.  Cardiovascular: Normal rate.   Respiratory: Effort normal. No respiratory distress.  GI: Soft. She exhibits no distension and no mass. There is no tenderness. There is no rebound and no guarding.  Genitourinary: There is no rash or lesion on the right labia. There is no rash or lesion on the left labia. Uterus is not deviated, not enlarged, not fixed and not tender. Cervix exhibits no motion tenderness, no discharge and no friability. Right adnexum displays tenderness. Right adnexum displays no mass and no fullness. Left adnexum displays tenderness. Left adnexum displays no mass and no fullness. No erythema, tenderness or bleeding around the vagina. Vaginal discharge (white, malodorous) found.  Neurological: She is alert and oriented to person, place, and time.  Skin: Skin is warm and dry.  Psychiatric: She has a normal mood and affect.    MAU Course  Procedures  Results for orders placed during the hospital encounter of 03/22/12 (from the past 24 hour(s))  URINALYSIS, ROUTINE W REFLEX MICROSCOPIC     Status: Abnormal   Collection Time   03/22/12  3:10 PM      Component Value Range   Color, Urine YELLOW  YELLOW   APPearance CLEAR  CLEAR   Specific Gravity, Urine >1.030 (*) 1.005 - 1.030   pH 5.5  5.0 - 8.0   Glucose, UA NEGATIVE  NEGATIVE mg/dL   Hgb urine dipstick NEGATIVE  NEGATIVE  Bilirubin Urine NEGATIVE  NEGATIVE   Ketones, ur NEGATIVE  NEGATIVE mg/dL   Protein, ur NEGATIVE  NEGATIVE mg/dL   Urobilinogen, UA 0.2  0.0 - 1.0 mg/dL   Nitrite NEGATIVE  NEGATIVE   Leukocytes, UA NEGATIVE  NEGATIVE  POCT PREGNANCY, URINE     Status: Abnormal   Collection Time   03/22/12  3:23 PM      Component Value Range   Preg Test, Ur POSITIVE (*) NEGATIVE  WET PREP, GENITAL     Status: Abnormal   Collection Time   03/22/12  5:06 PM      Component Value Range   Yeast Wet Prep HPF POC NONE  SEEN  NONE SEEN   Trich, Wet Prep NONE SEEN  NONE SEEN   Clue Cells Wet Prep HPF POC MODERATE (*) NONE SEEN   WBC, Wet Prep HPF POC MANY (*) NONE SEEN   US Ob Comp Less 14 Wks  03/22/2012  *RADIOLOGY REPORT*  Clinical Data: Early pregnancy.  Right-sided pain  OBSTETRIC <14 WK ULTRASOUND, TRANSVAGINAL OB US  Technique:  Transabdominal and transvaginal ultrasound was performed for evaluation of the gestation as well as the maternal uterus and adnexal regions.  Findings: Twin gestation is discovered.  Twin A:   Heart Rate : 160 bpm  CRL:  1.61         8w  1d                  [place] EDC: 10/31/2012  Twin B:   Heart Rate : 167 bpm  CRL:  1.614         8w  1d                 [place] EDC: 10/31/2012   Maternal uterus/adnexae: No subchorionic hemorrhage.  The right ovary appears normal.  The left ovary is not visualized. No free fluid identified within the pelvis.  IMPRESSION:  1.  Twin living gestations. 2.  Estimated estimated gestational age is 8 weeks and 1 day.   Original Report Authenticated By: Signa Kell, M.D.    US Ob Comp Addl Gest Less 14 Wks  03/22/2012  *RADIOLOGY REPORT*  Clinical Data: Early pregnancy.  Right-sided pain  OBSTETRIC <14 WK ULTRASOUND, TRANSVAGINAL OB US  Technique:  Transabdominal and transvaginal ultrasound was performed for evaluation of the gestation as well as the maternal uterus and adnexal regions.  Findings: Twin gestation is discovered.  Twin A:   Heart Rate : 160 bpm  CRL:  1.61         8w  1d                  [place] EDC: 10/31/2012  Twin B:   Heart Rate : 167 bpm  CRL:  1.614         8w  1d                 [place] EDC: 10/31/2012   Maternal uterus/adnexae: No subchorionic hemorrhage.  The right ovary appears normal.  The left ovary is not visualized. No free fluid identified within the pelvis.  IMPRESSION:  1.  Twin living gestations. 2.  Estimated estimated gestational age is 8 weeks and 1 day.   Original Report Authenticated By: Signa Kell, M.D.     Assessment and Plan   1. Twin  pregnancy, antepartum   2. Bacterial vaginosis       Medication List     As of 03/22/2012  5:52 PM  START taking these medications         clindamycin 300 MG capsule   Commonly known as: CLEOCIN   Take 1 capsule (300 mg total) by mouth 2 (two) times daily.      metoCLOPramide 10 MG tablet   Commonly known as: REGLAN   Take 1 tablet (10 mg total) by mouth 4 (four) times daily.      CONTINUE taking these medications         acetaminophen 325 MG tablet   Commonly known as: TYLENOL      albuterol 108 (90 BASE) MCG/ACT inhaler   Commonly known as: PROVENTIL HFA;VENTOLIN HFA          Where to get your medications    These are the prescriptions that you need to pick up. We sent them to a specific pharmacy, so you will need to go there to get them.   [id number] [place], [place] - [address]    [address] [place] [address]    Phone: [phone]        clindamycin 300 MG capsule   metoCLOPramide 10 MG tablet            Follow-up Information    Schedule an appointment as soon as possible for a visit with Fortino Sic, MD.   Contact information:   [address] [place] [address] [phone]            [facility] [place] 03/22/2012, 3:54 PM

## 2012-03-23 LAB — GC/CHLAMYDIA PROBE AMP
CT Probe RNA: NEGATIVE
GC Probe RNA: NEGATIVE

## 2012-04-25 ENCOUNTER — Other Ambulatory Visit: Payer: Self-pay

## 2013-01-14 ENCOUNTER — Other Ambulatory Visit: Payer: Self-pay

## 2013-01-25 ENCOUNTER — Encounter (HOSPITAL_COMMUNITY): Payer: Self-pay | Admitting: *Deleted

## 2013-04-08 IMAGING — US US OB EACH ADDL GEST<[ID]
1 series · 14 of 28 positions shown · non-contrast
Comparison: none

CLINICAL DATA: Early pregnancy.  Right-sided pain

OBSTETRIC <14 WK ULTRASOUND, TRANSVAGINAL OB US
TECHNIQUE: Transabdominal and transvaginal ultrasound was
performed for evaluation of the gestation as well as the maternal
uterus and adnexal regions.

[Series 1: us ob comp less 14 wks · 14 of 29 slices shown]
[im 2/29]
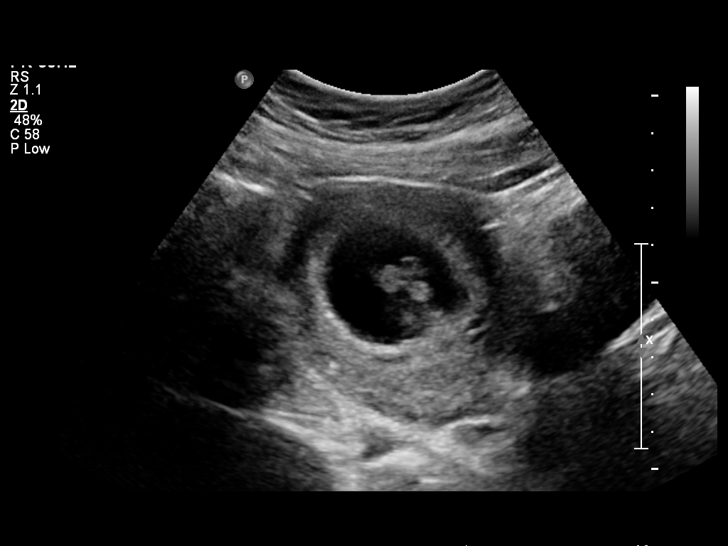
[im 4/29]
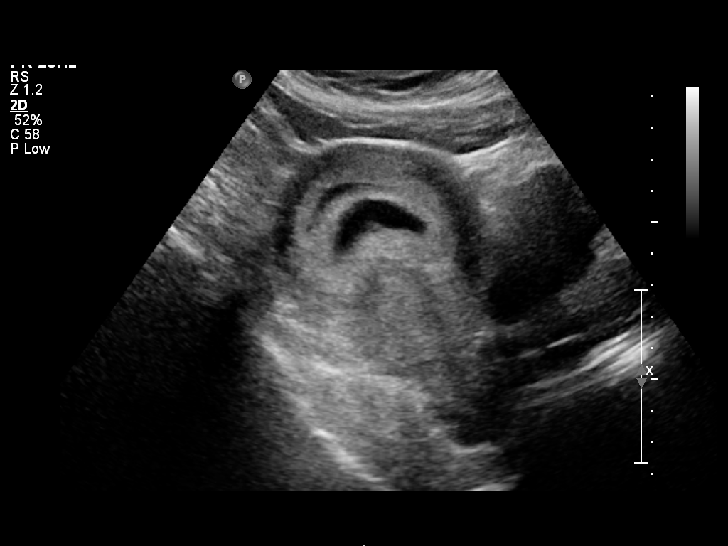
[im 6/29]
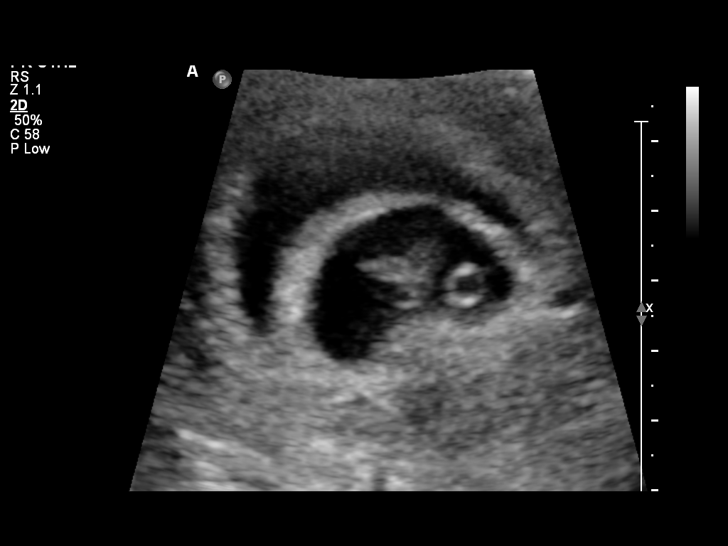
[im 8/29]
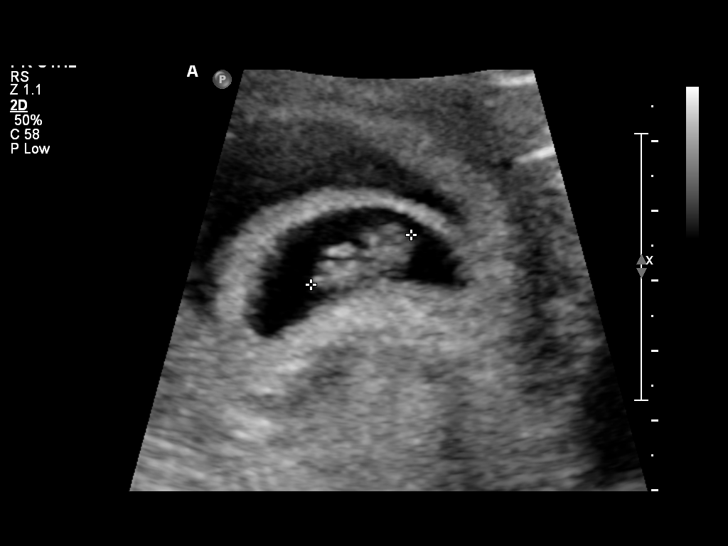
[im 10/29]
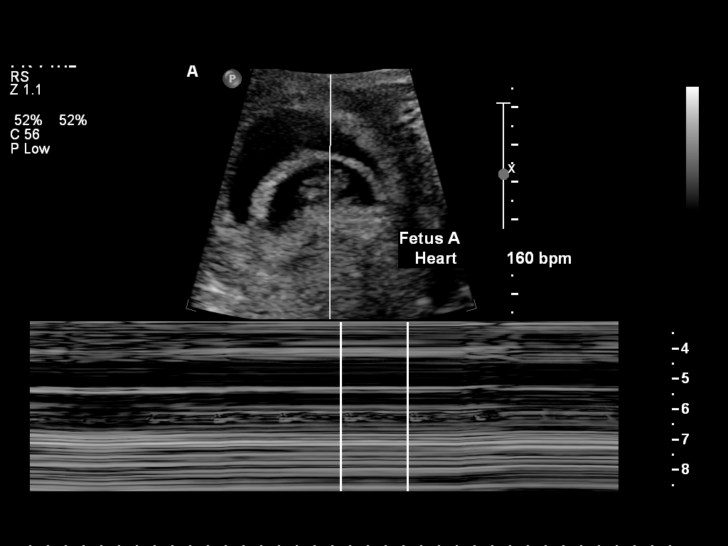
[im 12/29]
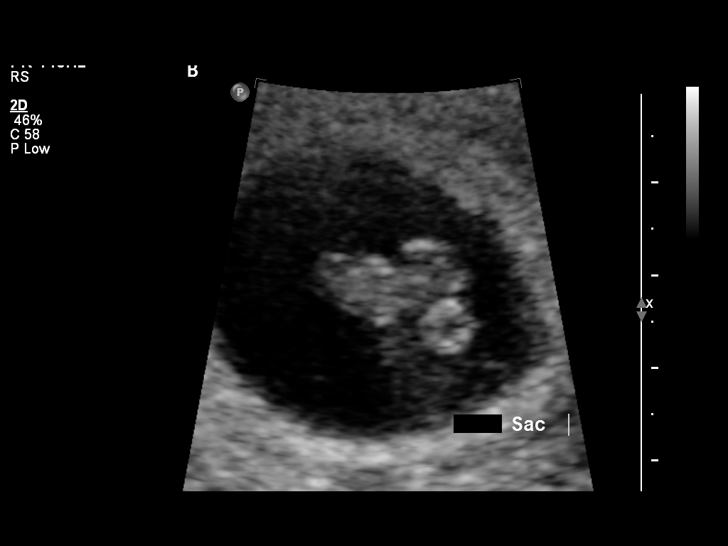
[im 14/29]
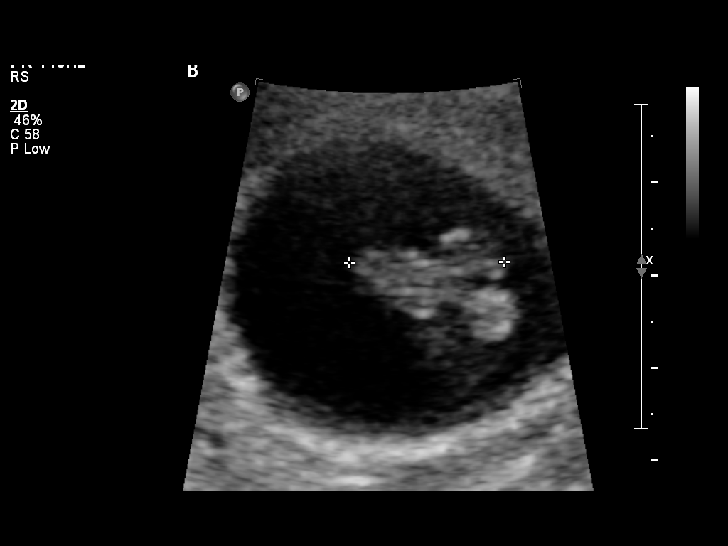
[im 16/29]
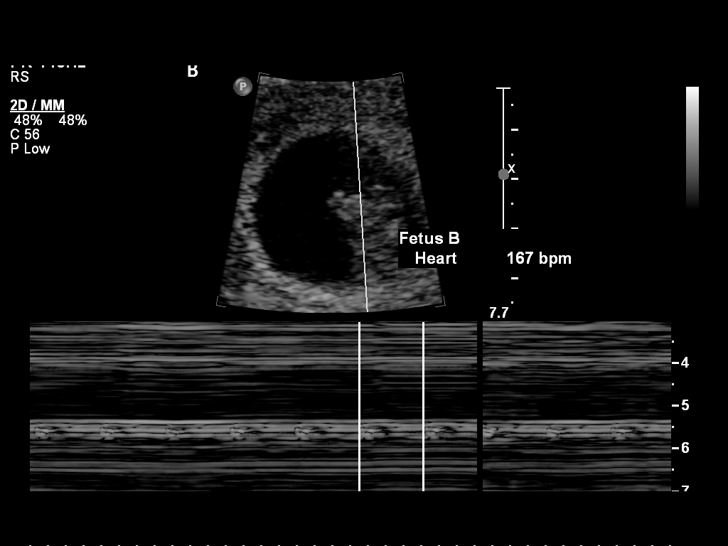
[im 18/29]
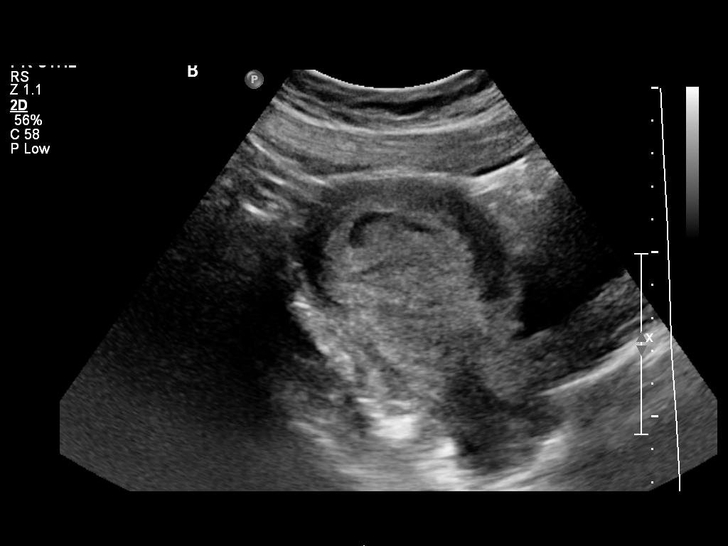
[im 20/29]
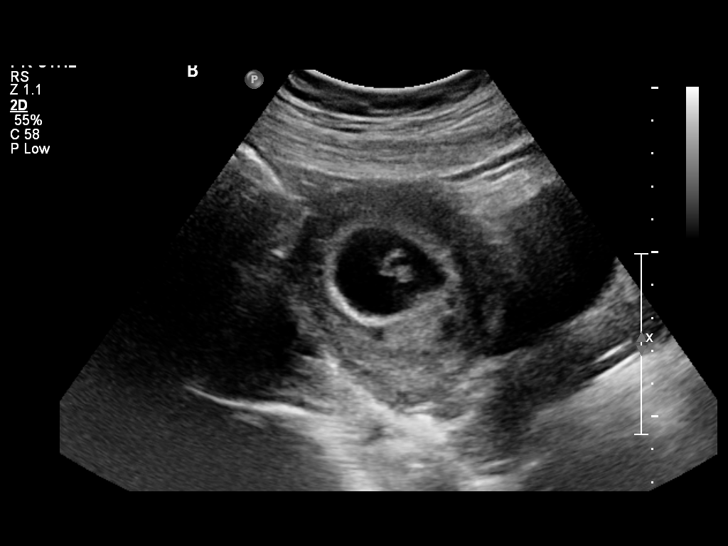
[im 22/29]
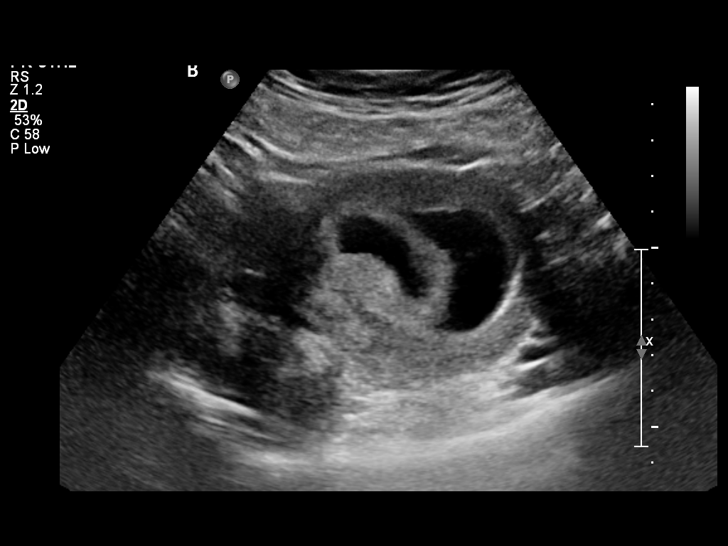
[im 24/29]
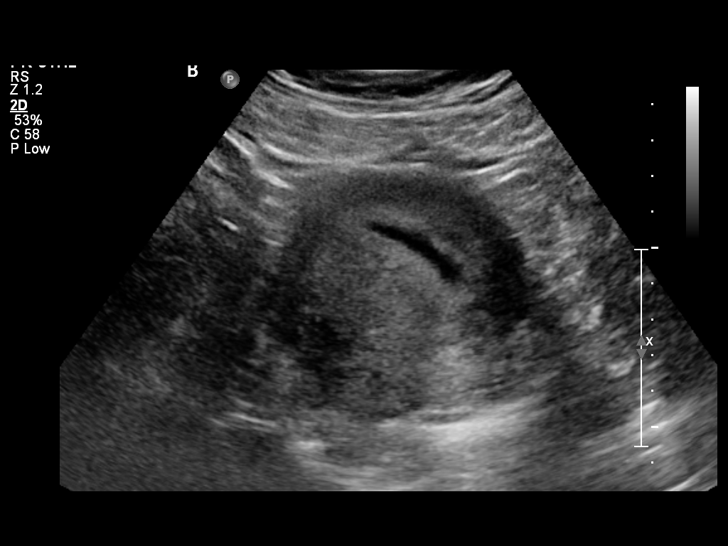
[im 26/29]
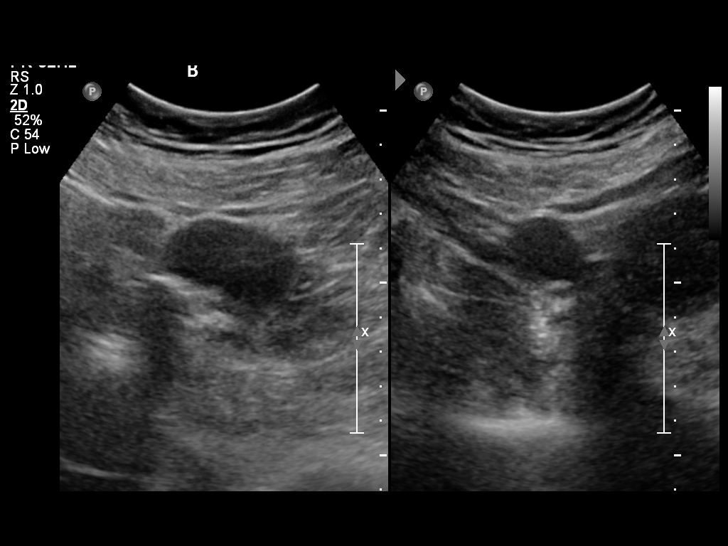
[im 29/29]
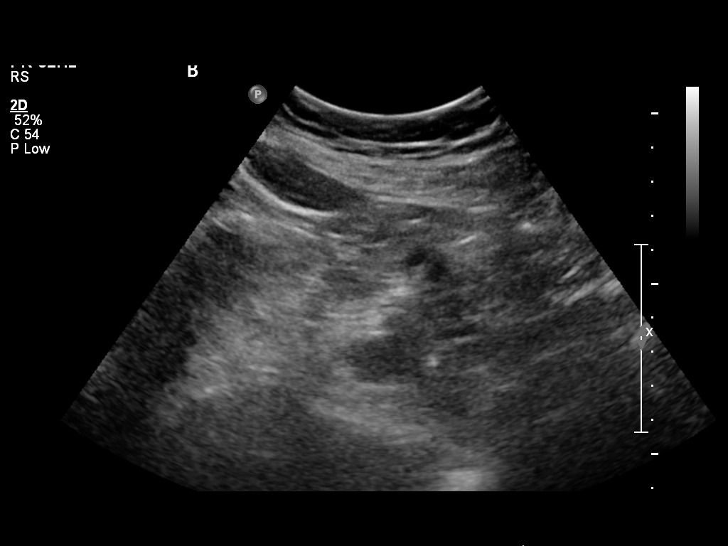

[14 of 28 positions shown; findings below may reference images not displayed]

FINDINGS: Twin gestation is discovered.

Twin A:

 Heart Rate : 160 bpm

CRL:  1.61         8w  1d                  [place] EDC: 10/31/2012

Twin B:

 Heart Rate : [167] bpm

CRL:  [1.614]         [8]w  [1]d                 [place] EDC:
[10/31/2012
]

Maternal uterus/adnexae:
No subchorionic hemorrhage.

The right ovary appears normal.  The left ovary is not visualized.
No free fluid identified within the pelvis.
IMPRESSION: 1.  Twin living gestations.
2.  Estimated estimated gestational age is 8 weeks and 1 day.

## 2013-10-11 DIAGNOSIS — R200 Anesthesia of skin: Secondary | ICD-10-CM | POA: Insufficient documentation

## 2013-10-11 DIAGNOSIS — M545 Low back pain, unspecified: Secondary | ICD-10-CM | POA: Insufficient documentation

## 2013-10-11 DIAGNOSIS — M5430 Sciatica, unspecified side: Secondary | ICD-10-CM | POA: Insufficient documentation

## 2013-12-24 ENCOUNTER — Other Ambulatory Visit: Payer: Self-pay

## 2014-01-10 ENCOUNTER — Encounter (HOSPITAL_COMMUNITY): Payer: Self-pay | Admitting: *Deleted

## 2014-09-13 ENCOUNTER — Other Ambulatory Visit: Payer: Self-pay | Admitting: Orthopedic Surgery

## 2014-09-13 DIAGNOSIS — S8252XA Displaced fracture of medial malleolus of left tibia, initial encounter for closed fracture: Secondary | ICD-10-CM

## 2014-09-14 ENCOUNTER — Ambulatory Visit
Admission: RE | Admit: 2014-09-14 | Discharge: 2014-09-14 | Disposition: A | Discharge status: Home / Self Care | Payer: BLUE CROSS/BLUE SHIELD | Source: Ambulatory Visit | Attending: Orthopedic Surgery | Admitting: Orthopedic Surgery

## 2014-09-14 DIAGNOSIS — S8252XA Displaced fracture of medial malleolus of left tibia, initial encounter for closed fracture: Secondary | ICD-10-CM

## 2015-06-20 DIAGNOSIS — R739 Hyperglycemia, unspecified: Secondary | ICD-10-CM | POA: Insufficient documentation

## 2015-06-20 DIAGNOSIS — M79605 Pain in left leg: Secondary | ICD-10-CM | POA: Insufficient documentation

## 2015-06-20 DIAGNOSIS — J45909 Unspecified asthma, uncomplicated: Secondary | ICD-10-CM | POA: Insufficient documentation

## 2015-11-07 DIAGNOSIS — G43009 Migraine without aura, not intractable, without status migrainosus: Secondary | ICD-10-CM | POA: Insufficient documentation

## 2016-03-12 ENCOUNTER — Emergency Department (HOSPITAL_BASED_OUTPATIENT_CLINIC_OR_DEPARTMENT_OTHER)
Admit: 2016-03-12 | Discharge: 2016-03-12 | Disposition: A | Discharge status: Home / Self Care | Payer: BLUE CROSS/BLUE SHIELD | Attending: Emergency Medicine | Admitting: Emergency Medicine

## 2016-03-12 ENCOUNTER — Emergency Department (HOSPITAL_COMMUNITY)
Admission: EM | Admit: 2016-03-12 | Discharge: 2016-03-12 | Disposition: A | Discharge status: Home / Self Care | Payer: BLUE CROSS/BLUE SHIELD | Attending: Emergency Medicine | Admitting: Emergency Medicine

## 2016-03-12 ENCOUNTER — Encounter (HOSPITAL_COMMUNITY): Payer: Self-pay | Admitting: Emergency Medicine

## 2016-03-12 DIAGNOSIS — L03112 Cellulitis of left axilla: Secondary | ICD-10-CM | POA: Diagnosis present

## 2016-03-12 DIAGNOSIS — J45909 Unspecified asthma, uncomplicated: Secondary | ICD-10-CM | POA: Insufficient documentation

## 2016-03-12 DIAGNOSIS — F17210 Nicotine dependence, cigarettes, uncomplicated: Secondary | ICD-10-CM | POA: Insufficient documentation

## 2016-03-12 DIAGNOSIS — M79602 Pain in left arm: Secondary | ICD-10-CM

## 2016-03-12 DIAGNOSIS — L02412 Cutaneous abscess of left axilla: Secondary | ICD-10-CM | POA: Diagnosis not present

## 2016-03-12 DIAGNOSIS — L03114 Cellulitis of left upper limb: Secondary | ICD-10-CM

## 2016-03-12 HISTORY — DX: Polyneuropathy, unspecified: G62.9

## 2016-03-12 MED ORDER — LIDOCAINE-EPINEPHRINE (PF) 2 %-1:200000 IJ SOLN
20.0000 mL | Freq: Once | INTRAMUSCULAR | Status: AC
  Administered 2016-03-12: 20 mL
  Filled 2016-03-12: qty 20

## 2016-03-12 MED ORDER — HYDROMORPHONE HCL 1 MG/ML IJ SOLN
0.5000 mg | Freq: Once | INTRAMUSCULAR | Status: AC
  Administered 2016-03-12: 0.5 mg via INTRAVENOUS
  Filled 2016-03-12: qty 1

## 2016-03-12 MED ORDER — SULFAMETHOXAZOLE-TRIMETHOPRIM 800-160 MG PO TABS
1.0000 | ORAL_TABLET | Freq: Once | ORAL | Status: AC
  Administered 2016-03-12: 1 via ORAL
  Filled 2016-03-12: qty 1

## 2016-03-12 MED ORDER — TRAMADOL HCL 50 MG PO TABS: 50.0000 mg | ORAL_TABLET | Freq: Four times a day (QID) | ORAL | 0 refills | Status: DC | PRN

## 2016-03-12 MED ORDER — ONDANSETRON 4 MG PO TBDP
4.0000 mg | ORAL_TABLET | Freq: Once | ORAL | Status: AC
  Administered 2016-03-12: 4 mg via ORAL
  Filled 2016-03-12: qty 1

## 2016-03-12 MED ORDER — ONDANSETRON HCL 4 MG/2ML IJ SOLN
4.0000 mg | Freq: Once | INTRAMUSCULAR | Status: AC
  Administered 2016-03-12: 4 mg via INTRAVENOUS
  Filled 2016-03-12: qty 2

## 2016-03-12 MED ORDER — SODIUM CHLORIDE 0.9 % IV BOLUS (SEPSIS)
1000.0000 mL | Freq: Once | INTRAVENOUS | Status: AC
  Administered 2016-03-12: 1000 mL via INTRAVENOUS

## 2016-03-12 NOTE — ED Provider Notes (Signed)
[facility]-EMERGENCY DEPT Provider Note   CSN: [id number] Arrival date & time: 03/12/16  1056     History   Chief Complaint Chief Complaint  Patient presents with  . Cellulitis    HPI Monique Wells is a 36 y.o. female.  HPI Patient presents to the emergency department with complaints of 4-5 days of increasing redness under her left axilla.  She was started on doxycycline yesterday.  She states worsening and increasing redness.  No drainage.  She's been trying warm compresses without improvement in her symptoms.  She was started on doxycycline.  Pain is moderate in severity and worse with palpation.  Today she noted extension of the cellulitis to the level of the left elbow   Past Medical History:  Diagnosis Date  . Asthma   . Headache(784.0)   . Neuropathy (HCC)    both legs    There are no active problems to display for this patient.   Past Surgical History:  Procedure Laterality Date  . WISDOM TOOTH EXTRACTION    . WRIST GANGLION EXCISION      OB History    Gravida Para Term Preterm AB Living   5 2 2   2 2    SAB TAB Ectopic Multiple Live Births   1       2       Home Medications    Prior to Admission medications   Medication Sig Start Date End Date Taking? Authorizing Provider  acetaminophen (TYLENOL) 500 MG tablet Take 500 mg by mouth every 6 (six) hours as needed. pain    Yes Historical Provider, MD  albuterol (PROVENTIL HFA;VENTOLIN HFA) 108 (90 BASE) MCG/ACT inhaler Inhale 2 puffs into the lungs every 6 (six) hours as needed. Asthma; exercise induced   Yes Historical Provider, MD  clonazePAM (KLONOPIN) 0.5 MG tablet Take 0.5 mg by mouth daily. 01/30/16  Yes Historical Provider, MD  doxycycline (VIBRAMYCIN) 100 MG capsule Take 100 mg by mouth 2 (two) times daily.   Yes Historical Provider, MD  ibuprofen (ADVIL,MOTRIN) 200 MG tablet Take 800 mg by mouth every 6 (six) hours as needed.   Yes Historical Provider, MD  methocarbamol (ROBAXIN) 500 MG tablet Take 500 mg by  mouth 4 (four) times daily. 02/09/16  Yes Historical Provider, MD  pregabalin (LYRICA) 75 MG capsule Take 75 mg by mouth 3 (three) times daily.   Yes Historical Provider, MD  traMADol (ULTRAM) 50 MG tablet Take 50 mg by mouth every 8 (eight) hours as needed for pain. 03/08/16 04/07/16 Yes Historical Provider, MD    Family History Family History  Problem Relation Age of Onset  . Cancer Mother   . Cancer Father   . Hypertension Father     Social History Social History  Substance Use Topics  . Smoking status: Current Every Day Smoker    Packs/day: 1.00    Types: Cigarettes  . Smokeless tobacco: Never Used  . Alcohol use Yes     Comment: occ     Allergies   Morphine and related; Flagyl [metronidazole]; and Vicodin [hydrocodone-acetaminophen]   Review of Systems Review of Systems  All other systems reviewed and are negative.    Physical Exam Updated Vital Signs BP 153/90 (BP Location: Right Arm)   Pulse 100   Temp 98 F (36.7 C) (Oral)   Resp 16   Wt 220 lb (99.8 kg)   LMP 02/26/2016 (Exact Date)   SpO2 99%   BMI 36.61 kg/m   Physical Exam  Constitutional: She is oriented to person, place, and time. She appears well-developed and well-nourished.  HENT:  Head: Normocephalic.  Eyes: EOM are normal.  Neck: Normal range of motion.  Pulmonary/Chest: Effort normal.  Abdominal: She exhibits no distension.  Musculoskeletal:  Fluctuant abscess of the left axillary region with spreading cellulitis of her left medial upper arm to the level of the elbow.  Neurological: She is alert and oriented to person, place, and time.  Psychiatric: She has a normal mood and affect.  Nursing note and vitals reviewed.    ED Treatments / Results  Labs (all labs ordered are listed, but only abnormal results are displayed) Labs Reviewed - No data to display  EKG  EKG Interpretation None       Radiology No results found.  Procedures Procedures (including critical care  time)   +++++++++++++++++++++++++++++++++++++++++++++++++  INCISION AND DRAINAGE Performed by: Lyanne Co Consent: Verbal consent obtained. Risks and benefits: risks, benefits and alternatives were discussed Time out performed prior to procedure Type: abscess Body area: left axilla Anesthesia: local infiltration Incision was made with a scalpel. Local anesthetic: lidocaine 2% with epinephrine Anesthetic total: 5 ml Complexity: complex Blunt dissection to break up loculations Drainage: purulent Drainage amount: large Packing material: none Patient tolerance: Patient tolerated the procedure well with no immediate complications.   +++++++++++++++++++++++++++++++++++++++++++++++++++   Medications Ordered in ED Medications  ondansetron (ZOFRAN-ODT) disintegrating tablet 4 mg (4 mg Oral Given 03/12/16 1144)  sulfamethoxazole-trimethoprim (BACTRIM DS,SEPTRA DS) 800-160 MG per tablet 1 tablet (1 tablet Oral Given 03/12/16 1433)  sodium chloride 0.9 % bolus 1,000 mL (0 mLs Intravenous Stopped 03/12/16 1635)  ondansetron (ZOFRAN) injection 4 mg (4 mg Intravenous Given 03/12/16 1433)  HYDROmorphone (DILAUDID) injection 0.5 mg (0.5 mg Intravenous Given 03/12/16 1433)  lidocaine-EPINEPHrine (XYLOCAINE W/EPI) 2 %-1:200000 (PF) injection 20 mL (20 mLs Infiltration Given 03/12/16 1433)  HYDROmorphone (DILAUDID) injection 0.5 mg (0.5 mg Intravenous Given 03/12/16 1526)     Initial Impression / Assessment and Plan / ED Course  I have reviewed the triage vital signs and the nursing notes.  Pertinent labs & imaging results that were available during my care of the patient were reviewed by me and considered in my medical decision making (see chart for details).  Clinical Course     Pain improved.  Incision and drainage of abscess with large amount of purulent drainage.  Patient will continue her antibiotics.  No indication for changing antibiotics at this time now with appropriate I and D.  Patient  understands to return to the ER for new or worsening symptoms  Final Clinical Impressions(s) / ED Diagnoses   Final diagnoses:  Abscess of left axilla  Cellulitis of left upper arm    New Prescriptions New Prescriptions   No medications on file     Azalia Bilis, MD 03/12/16 [phone]

## 2016-03-12 NOTE — Progress Notes (Signed)
*  PRELIMINARY RESULTS* Vascular Ultrasound Left upper extremity venous duplex has been completed.  Preliminary findings: The visualized veins of the left upper extremity appear negative for deep and superficial vein thrombosis.  Somewhat limited visualization due to patient tolerance and penetration through edema. Per patient, known axillary abscess.  Preliminary results given to Doctor.  Monique FischerCharlotte C Reilynn Wells 03/12/2016, 3:18 PM

## 2016-03-12 NOTE — ED Triage Notes (Signed)
Pt reports 5 day hx of increasing redness under l/arm. Redness extends from l/axillary to elbow. Pt noted increased pain and redness over last 24 hours. Pt was started on doxycycline by urgent care MD yesterday.Using Tylenol and motrin for pain. Last dosage of Tylenol at 1100.

## 2016-04-25 ENCOUNTER — Ambulatory Visit (INDEPENDENT_AMBULATORY_CARE_PROVIDER_SITE_OTHER): Payer: BLUE CROSS/BLUE SHIELD | Admitting: Gynecology

## 2016-04-25 ENCOUNTER — Encounter: Payer: Self-pay | Admitting: Gynecology

## 2016-04-25 VITALS — BP 120/78 | Ht 67.0 in | Wt 217.0 lb

## 2016-04-25 DIAGNOSIS — Z01419 Encounter for gynecological examination (general) (routine) without abnormal findings: Secondary | ICD-10-CM | POA: Diagnosis not present

## 2016-04-25 DIAGNOSIS — Z113 Encounter for screening for infections with a predominantly sexual mode of transmission: Secondary | ICD-10-CM | POA: Diagnosis not present

## 2016-04-25 DIAGNOSIS — Z1151 Encounter for screening for human papillomavirus (HPV): Secondary | ICD-10-CM | POA: Diagnosis not present

## 2016-04-25 DIAGNOSIS — Z13220 Encounter for screening for lipoid disorders: Secondary | ICD-10-CM

## 2016-04-25 LAB — CBC WITH DIFFERENTIAL/PLATELET
BASOS ABS: 97 {cells}/uL (ref 0–200)
Basophils Relative: 1 %
EOS PCT: 2 %
Eosinophils Absolute: 194 cells/uL (ref 15–500)
HCT: 38.6 % (ref 35.0–45.0)
HEMOGLOBIN: 12.5 g/dL (ref 11.7–15.5)
LYMPHS ABS: 4656 {cells}/uL — AB (ref 850–3900)
Lymphocytes Relative: 48 %
MCH: 26.6 pg — AB (ref 27.0–33.0)
MCHC: 32.4 g/dL (ref 32.0–36.0)
MCV: 82.1 fL (ref 80.0–100.0)
MPV: 8.6 fL (ref 7.5–12.5)
Monocytes Absolute: 485 cells/uL (ref 200–950)
Monocytes Relative: 5 %
NEUTROS PCT: 44 %
Neutro Abs: 4268 cells/uL (ref 1500–7800)
Platelets: 334 10*3/uL (ref 140–400)
RBC: 4.7 MIL/uL (ref 3.80–5.10)
RDW: 14.5 % (ref 11.0–15.0)
WBC: 9.7 10*3/uL (ref 3.8–10.8)

## 2016-04-25 LAB — COMPREHENSIVE METABOLIC PANEL
ALBUMIN: 4.2 g/dL (ref 3.6–5.1)
ALT: 9 U/L (ref 6–29)
AST: 12 U/L (ref 10–30)
Alkaline Phosphatase: 61 U/L (ref 33–115)
BILIRUBIN TOTAL: 0.2 mg/dL (ref 0.2–1.2)
BUN: 9 mg/dL (ref 7–25)
CHLORIDE: 102 mmol/L (ref 98–110)
CO2: 25 mmol/L (ref 20–31)
CREATININE: 0.73 mg/dL (ref 0.50–1.10)
Calcium: 9 mg/dL (ref 8.6–10.2)
GLUCOSE: 89 mg/dL (ref 65–99)
Potassium: 3.8 mmol/L (ref 3.5–5.3)
SODIUM: 137 mmol/L (ref 135–146)
Total Protein: 6.7 g/dL (ref 6.1–8.1)

## 2016-04-25 LAB — LIPID PANEL
Cholesterol: 185 mg/dL (ref <200)
HDL: 42 mg/dL — ABNORMAL LOW (ref >50)
LDL CALC: 110 mg/dL — AB (ref <100)
TRIGLYCERIDES: 166 mg/dL — AB (ref <150)
Total CHOL/HDL Ratio: 4.4 Ratio (ref <5.0)
VLDL: 33 mg/dL — AB (ref <30)

## 2016-04-25 LAB — HIV ANTIBODY (ROUTINE TESTING W REFLEX): HIV 1&2 Ab, 4th Generation: NONREACTIVE

## 2016-04-25 NOTE — Addendum Note (Signed)
Addended by: Dayna BarkerGARDNER, Kylieann Eagles K on: 04/25/2016 04:25 PM   Modules accepted: Orders

## 2016-04-25 NOTE — Patient Instructions (Signed)
Followup for IUD placement as we discussed. 

## 2016-04-25 NOTE — Progress Notes (Signed)
    Lala LundBrie L Ramus 05/18/1980 [id number]        35 y.o.  [name] for annual exam.  Has not been in the office for a number of years. Several issues noted below.  Past medical history,surgical history, problem list, medications, allergies, family history and social history were all reviewed and documented as reviewed in the EPIC chart.  ROS:  Performed with pertinent positives and negatives included in the history, assessment and plan.   Additional significant findings :  None   Exam: Kennon PortelaKim Gardner assistant Vitals:   04/25/16 1523  BP: 120/78  Weight: 217 lb (98.4 kg)  Height: 5\' 7"  (1.702 m)   Body mass index is 33.99 kg/m.  General appearance:  Normal affect, orientation and appearance. Skin: Grossly normal HEENT: Without gross lesions.  No cervical or supraclavicular adenopathy. Thyroid normal.  Lungs:  Clear without wheezing, rales or rhonchi Cardiac: RR, without RMG Abdominal:  Soft, nontender, without masses, guarding, rebound, organomegaly or hernia Breasts:  Examined lying and sitting without masses, retractions, discharge or axillary adenopathy. Pelvic:  Ext, BUS, Vagina normal  Cervix normal. GC/Chlamydia, Pap/HPV  Uterus anteverted, normal size, shape and contour, midline and mobile nontender   Adnexa without masses or tenderness    Anus and perineum normal   Rectovaginal normal sphincter tone without palpated masses or tenderness.    Assessment/Plan:  36 y.o. [id number] female for annual exam with regular menses, no contraception.   1. Contraception. Had not been sexually active for years but is contemplating activity now. Discussed birth control options with her. She does smoke and I discussed increased risk of thrombosis associated with birth control pills smoking and age. Alternatives to include Depo-Provera, Nexplanon, IUDs, sterilization reviewed. Strongly recommended patient consider Mirena IUD both from a contraceptive standpoint and menstrual suppressive standpoint.  Patient is going to look into this and schedule as she chooses. Understands the need to use contraception if she is active. 2. STD screening. Patient requests STD screening. No known exposure but wants to be screened. GC/Chlamydia, HIV, RPR, hepatitis B, hepatitis C done. 3. Breast health. SBE monthly reviewed. Screening mammographic recommendations between 35 and 40 reviewed. No strong family history and patient prefers to wait to 40. 4. Health maintenance. Baseline CBC, CMP, lipid profile, urinalysis ordered along with her STD screening. Patient will follow up for Mirena IUD if she chooses. Follow up in one year for annual exam.   [name],Angelia Hazell P MD, 4:08 PM 04/25/2016

## 2016-04-26 ENCOUNTER — Encounter: Payer: Self-pay | Admitting: Gynecology

## 2016-04-26 LAB — URINALYSIS W MICROSCOPIC + REFLEX CULTURE
BILIRUBIN URINE: NEGATIVE
Bacteria, UA: NONE SEEN [HPF]
Casts: NONE SEEN [LPF]
Crystals: NONE SEEN [HPF]
Glucose, UA: NEGATIVE
Hgb urine dipstick: NEGATIVE
KETONES UR: NEGATIVE
Leukocytes, UA: NEGATIVE
NITRITE: NEGATIVE
PH: 6 (ref 5.0–8.0)
Protein, ur: NEGATIVE
RBC / HPF: NONE SEEN RBC/HPF (ref <=2)
SPECIFIC GRAVITY, URINE: 1.012 (ref 1.001–1.035)
WBC UA: NONE SEEN WBC/HPF (ref <=5)
Yeast: NONE SEEN [HPF]

## 2016-04-26 LAB — GC/CHLAMYDIA PROBE AMP
CT PROBE, AMP APTIMA: NOT DETECTED
GC Probe RNA: NOT DETECTED

## 2016-04-26 LAB — HEPATITIS C ANTIBODY: HCV Ab: NEGATIVE

## 2016-04-26 LAB — RPR

## 2016-04-26 LAB — HEPATITIS B SURFACE ANTIGEN: HEP B S AG: NEGATIVE

## 2016-04-29 LAB — PAP IG AND HPV HIGH-RISK: HPV DNA High Risk: DETECTED — AB

## 2016-05-08 ENCOUNTER — Encounter: Payer: Self-pay | Admitting: Gynecology

## 2016-05-08 ENCOUNTER — Ambulatory Visit (INDEPENDENT_AMBULATORY_CARE_PROVIDER_SITE_OTHER): Payer: BLUE CROSS/BLUE SHIELD | Admitting: Gynecology

## 2016-05-08 VITALS — BP 124/76

## 2016-05-08 DIAGNOSIS — D069 Carcinoma in situ of cervix, unspecified: Secondary | ICD-10-CM | POA: Diagnosis not present

## 2016-05-08 NOTE — Progress Notes (Signed)
    Monique Wells L Driver 08/19/1980 [id number]        35 y.o.  [id number] presents for colposcopy. History of most recent Pap smear showing cells suspicious for squamous cell carcinoma. Positive high-risk HPV screen. [facility]. Did have a history of colposcopy and number of years ago with records not available at this time.  Past medical history,surgical history, problem list, medications, allergies, family history and social history were all reviewed and documented in the EPIC chart.  Directed ROS with pertinent positives and negatives documented in the history of present illness/assessment and plan.  Exam: Kennon PortelaKim Wells assistant Vitals:   05/08/16 1219  BP: 124/76   General appearance:  Normal External BUS vagina normal. Cervix grossly normal.  Colposcopy performed after acetic acid cleanse is adequate using endocervical speculum. Slight bulbous change to the 6:00 area with reddening of the mucosa but no significant findings such as acetowhite change, mosaicism, punctation or atypical vessels. Four-quadrant random biopsies taken after four-quadrant injection of 2% lidocaine with 1:100,000 epinephrine 6 cc total injection. ECC performed. Monsel's solution hemostasis applied afterwards  Assessment/Plan:  36 y.o. 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 No overt significant findings on colposcopy. Four-quadrant biopsy taken. ECC performed. Various scenarios discussed to include invasive carcinoma on biopsies with adequate logic oncologist referral. Atypia or no findings on biopsy leading to cone biopsy follow up. Patient will follow up for biopsy results in several days.    Monique Wells,Monique Wells, 12:44 PM 05/08/2016

## 2016-05-08 NOTE — Patient Instructions (Signed)
Office will call you with biopsy results 

## 2016-05-10 LAB — PATHOLOGY

## 2016-06-05 ENCOUNTER — Ambulatory Visit (INDEPENDENT_AMBULATORY_CARE_PROVIDER_SITE_OTHER): Payer: BLUE CROSS/BLUE SHIELD | Admitting: Gynecology

## 2016-06-05 ENCOUNTER — Encounter: Payer: Self-pay | Admitting: Gynecology

## 2016-06-05 VITALS — BP 128/76

## 2016-06-05 DIAGNOSIS — D069 Carcinoma in situ of cervix, unspecified: Secondary | ICD-10-CM

## 2016-06-05 NOTE — Patient Instructions (Signed)
Followup for LEEP as scheduled 

## 2016-06-05 NOTE — Progress Notes (Signed)
    Monique Wells 12/13/1980 [id number]        35 y.o.  [id number] presents for reexamination. History of first Pap smear in a number of years showing cells suspicious of carcinoma. 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 Reviewed possibilities with the patient and the options for LEEP versus Cone biopsy in the operating room. I asked her to come in to let me reexamine her from a technical standpoint as to which procedure would be best.  Past medical history,surgical history, problem list, medications, allergies, family history and social history were all reviewed and documented in the EPIC chart.  Directed ROS with pertinent positives and negatives documented in the history of present illness/assessment and plan.  Exam: Biomedical scientistBlanca Wells Vitals:   06/05/16 1155  BP: 128/76   General appearance:  Normal Pelvic external BUS vagina normal. Cervix grossly normal. Uterus normal size midline mobile nontender. Adnexa without masses or tenderness  Colposcopy performed after acetic acid cleanse again without significant findings such as acetowhite change atypical vessels leukoplakia punctations or mosaicism.  Assessment/Plan:  36 y.o. G5P20 with carcinoma in situ. Reviewed with patient the significance of these findings and the possibility of microinvasion/frank invasion. From a technical standpoint I think that we can do a LEEP in the office with the larger loop. She clearly understands though I cannot guarantee there will not be residual disease with cut through lesion requiring further evaluation and treatment. Possibilities if microinvasion or frank invasion filed to include hysterectomy/radical hysterectomy. Childbearing is still issue and this will need to be addressed pending pathology results. Patient will make an appointment for LEEP she understands the importance of follow up. Was involved with the  procedure was discussed as well as postoperative care.  Greater than 50% of my time was spent in direct face to face counseling and coordination of care with the patient.     Monique Wells,Monique Wells P MD, 12:28 PM 06/05/2016

## 2016-06-20 ENCOUNTER — Encounter: Payer: Self-pay | Admitting: Gynecology

## 2016-06-20 ENCOUNTER — Ambulatory Visit (INDEPENDENT_AMBULATORY_CARE_PROVIDER_SITE_OTHER): Payer: BLUE CROSS/BLUE SHIELD | Admitting: Gynecology

## 2016-06-20 VITALS — BP 120/76

## 2016-06-20 DIAGNOSIS — D061 Carcinoma in situ of exocervix: Secondary | ICD-10-CM | POA: Diagnosis not present

## 2016-06-20 HISTORY — PX: LEEP: SHX91

## 2016-06-20 NOTE — Patient Instructions (Signed)
Office will call you biopsy results

## 2016-06-20 NOTE — Progress Notes (Signed)
    Monique Wells 1980-10-02 [id number]        35 y.o.  [id number] presents for LEEP procedure.  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  I reviewed the procedure with her to include the risks of infection, bleeding requiring retreatment and damage to surrounding tissues including vagina, bladder and rectum. I reviewed the possible risks of infertility, miscarriage and preterm delivery associated with cervical treatments.  The various pathology result possibilities were reviewed with her to include pathology found with complete excision, cut through lesions with need for possible future treatments, as well as no pathology found.  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 Patient understands and accepts the above.   Procedure:   A pelvic exam was performed and was normal.   The patient was properly grounded and prepared for the procedure. The cervix was visualized with a LEEP speculum cleansed with acetic acid and re-colposcopy performed. The cervix was circumferentially injected with 2% lidocaine solution with epinephrine 1 / 100,000 dilution, a total of 8 cc's were used. The cervix was then stained with Lugol's solution, which allowed for visualization of the transformation zone grossly. The LEEP generator was set at 70 W cutting 60 W coagulation blend one current. Using the 20 x 12 mm LEEP wand, the LEEP specimen was excised in 2 passes. The first representing the anterior lip and the second representing the posterior lip. A separate endocervical button was taken using the 10 x 10 mm wand. ECC was performed.  Ball coagulation was delivered to the LEEP base to achieve hemostasis and prophylactic Monsel's solution was applied. The specimens were pinned on the cork, separately identifying the anterior and posterior lips, separate specimen for the  endocervical button and separate specimen for the ECC were all sent to pathology. The patient tolerated the procedure well and postoperative instructions were discussed with her and a postoperative instruction sheet was given to her. Patient knows to follow up for pathology results in several days and then we will discuss the long-term plan as far as follow up screening.   Dara Lords MD, 11:21 AM 06/20/2016

## 2016-06-24 LAB — PATHOLOGY

## 2016-07-03 ENCOUNTER — Telehealth: Payer: Self-pay

## 2016-07-03 NOTE — Telephone Encounter (Signed)
Patient received my message that you would like her to schedule appt to come discuss options.  She said she has been here for visits "like 6 times in the past two mos" and it's really hard for me to get there.  She asked if you could discuss her options with her over the phone?

## 2016-07-04 NOTE — Telephone Encounter (Signed)
I'll call her next week

## 2016-07-05 NOTE — Telephone Encounter (Signed)
I spoke with patient and let her know TF will call her next week.

## 2016-07-08 ENCOUNTER — Telehealth: Payer: Self-pay | Admitting: Gynecology

## 2016-07-08 NOTE — Telephone Encounter (Signed)
I called the patient in follow up of her LEEP results and after consultation with Dr. Adolphus Birchwood. I reviewed Dr. Oliver Hum note with the patient.    "The text-book/boards answer is to do another excisional procedure and wait 6 weeks before a simple hyst (if she no longer wants fertility and understands the risk of future vaginal dysplasia).  However, in truth, we are becoming less radical with our radical procedures for microinvasive cancer (if there is no LVSI, tumors less than 1cm). And those women would likely only need a "simple" hysterectomy. Therefore, if she would prefer to avoid a second excisional procedure, it is reasonable to proceed with hysterectomy provided you feel comfortable from your exam that there isn't a big parametrial tumor that you are likely to "cut through". If microinvasion is present on hysterectomy specimen, we would review her path to look at margin status, and other "risk factors" such as LVSI, depth of invasion and size of tumor to determine if she would need a radical parametrectomy or postop radiation (in the vast majority of these cases, that isn't necessary).   If she isn't interested in hysterectomy at this time, a cone is reasonable to ensure you get up nice and high in the endocervical canal. If the margins are clear on the cone, you can consider her treated."  I discussed 3 options to include proceeding with Cone biopsy now either from a conservative try to preserve the uterus approach or as a rule out invasion before proceeding with a simple hysterectomy. The third option would be to proceed with simple hysterectomy understanding that if any of the factors as noted in the above note by Dr. Andrey Farmer that she may need further surgery afterwards. I answered many questions from the patient and she wants to think about how she wants to proceed and will call back with her decision. I relayed that regardless we would wait approximately 2 months from her LEEP before doing the  second procedure to allow complete healing.

## 2016-07-09 ENCOUNTER — Telehealth: Payer: Self-pay

## 2016-07-09 NOTE — Telephone Encounter (Signed)
I would have to examine her to decide between LAVH and TVH but I think it would be LAVH

## 2016-07-09 NOTE — Telephone Encounter (Signed)
Monique Wells called back and said she had talked with family and decided that July 3rd just going to be to close to beach trip.  She thinks she would like to take the June 19th date.  Dr. Vladimir Crofts to go ahead and schedule her LAVH and schedule pre op for you to examine her and firm up type hysterectomy, etc.

## 2016-07-09 NOTE — Telephone Encounter (Signed)
I spoke with patient about this. Patient said her family is going to the beach July 21 and she would like to include that week in her recover to minimize time out of work.  She asked about scheduling last week of June but that day is full. Patient asked about July 3rd. She wants to schedule then if you think she will be okay to wait until then. Also, she asked if she could get in ocean at that point 3 1/2-4 weeks after surgery.

## 2016-07-09 NOTE — Telephone Encounter (Signed)
Patient called me about hysterectomy and had some questions about length of stay, etc.  What type of hysterectomy are you planning?

## 2016-07-10 NOTE — Telephone Encounter (Signed)
I spoke with patient and surgery scheduled for 08/27/16.  She was scheduled for pre op appt as well.

## 2016-07-10 NOTE — Telephone Encounter (Signed)
June 19th date ok

## 2016-08-14 ENCOUNTER — Ambulatory Visit (INDEPENDENT_AMBULATORY_CARE_PROVIDER_SITE_OTHER): Payer: BLUE CROSS/BLUE SHIELD | Admitting: Gynecology

## 2016-08-14 ENCOUNTER — Encounter: Payer: Self-pay | Admitting: Gynecology

## 2016-08-14 ENCOUNTER — Telehealth: Payer: Self-pay

## 2016-08-14 ENCOUNTER — Other Ambulatory Visit: Payer: Self-pay | Admitting: Gynecology

## 2016-08-14 VITALS — BP 130/82

## 2016-08-14 DIAGNOSIS — D069 Carcinoma in situ of cervix, unspecified: Secondary | ICD-10-CM

## 2016-08-14 MED ORDER — PHENAZOPYRIDINE HCL 200 MG PO TABS: ORAL_TABLET | ORAL | 0 refills | Status: DC

## 2016-08-14 NOTE — Progress Notes (Addendum)
RONEE RANGANATHAN 21-Dec-1980 [id number]   Preoperative consult  Chief complaint: Carcinoma in situ of the cervix  History of present illness: 36 y.o. [id number] who presented for annual exam after not having had GYN exams/Pap smears for number of years with Pap smear suspicious for squamous cell carcinoma. Follow up colposcopy with biopsies showed carcinoma in situ in 4 quadrant biopsies as well as her ECC.  Subsequent LEEP 06/2016 showed carcinoma in situ with extension into the endocervical glands. Comment noted due to the extension into the endocervical glands superficial invasion could not be definitely excluded.  Consultation with gynecologic oncologist Dr. Andrey Farmer as noted in the 07/08/2016 telephone note that options include Cone biopsy versus simple hysterectomy which would be an option but if on review of the final pathology significant disease is encountered that she may need a second procedure to include radical parametrectomy/radiation possible lymph node sampling. In discussion with the patient future fertility is not an issue and she would like to avoid the cone biopsy as a second procedure and wants to proceed directly to the simple hysterectomy clearly understanding the issues and risks of needing further evaluation and treatment if significant pathology is encountered beyond carcinoma in situ. 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 She is at risk for vaginal dysplasia in the future will need to be followed for this. Patient is admitted for LAVH bilateral salpingectomies.  Past medical history,surgical history, medications, allergies, family history and social history were all reviewed and documented in the EPIC chart.  ROS:  Was performed and pertinent positives and negatives are included in the history of present illness.  Exam:  Kennon Portela assistant Vitals:   08/14/16 1219  BP: 130/82   General: well developed, well  nourished female, no acute distress HEENT: normal  Lungs: clear to auscultation without wheezing, rales or rhonchi  Cardiac: regular rate without rubs, murmurs or gallops  Abdomen: soft, nontender without masses, guarding, rebound, organomegaly  Pelvic: external bus vagina: normal   Cervix: grossly normal  Uterus: normal size, midline and mobile, nontender  Adnexa: without masses or tenderness    Assessment/Plan:  36 y.o. [id number] With history as above admitted for laparoscopic-assisted vaginal hysterectomy bilateral salpingectomies. I reviewed the proposed surgery with her to include the expected intraoperative and postoperative courses as well as the recovery period. The patient understands that at any time during the procedure I may convert from a laparoscopic approach to an open TAH if complications or difficulties are encountered during the laparoscopic approach with a bigger incision and a longer recovery period. The absolute and irreversible sterility associated with hysterectomy was discussed and again we discussed and offered a more conservative approach to include cone biopsy to preserve fertility but she wants to proceed with the hysterectomy accepting the sterility. Sexuality following hysterectomy was also reviewed and the potential for persistent dyspareunia and orgasmic dysfunction was discussed. The ovarian conservation issue was reviewed. She has no strong family history of ovarian cancer. Given her young age the benefits of ovarian conservation for ongoing hormone production providing symptom relief, cardiovascular and bone health benefits were reviewed. The risks of ovarian disease in the future up to including ovarian cancer with ovarian conservation discussed. The patient wants to keep both ovaries accepting the risks of ovarian disease. She does give me permission to remove one or both ovaries at the time of surgery if significant pathology is encountered. The risks of infection,  prolonged  antibiotics, reoperation for abscess or hematoma formation was discussed. The risks of hemorrhage necessitating transfusion and the risks of transfusion reaction, hepatitis, HIV, mad cow disease and other unknown entities was also discussed. Incisional complications to include opening and draining of incisions and closure by secondary intention, dehiscence and long-term issues of keloid/cosmetics and hernia formation were reviewed. The risk of inadvertent injury to internal organs including bowel, bladder, ureters, vessels, nerves either immediately recognized or delay recognized necessitating major exploratory reparative surgeries and future reparative surgeries including bowel resection, ostomy formation, bladder repair, ureteral damage repair was discussed with her. The patient's questions were answered to her satisfaction and she is ready to proceed with surgery.   [name],TIMOTHY P MD, 12:38 PM 08/14/2016

## 2016-08-14 NOTE — Patient Instructions (Signed)
Followup for surgery as scheduled. 

## 2016-08-14 NOTE — H&P (Addendum)
Monique Wells May 17, 1980 [id number]   History and Physical  Chief complaint: Carcinoma in situ of the cervix  History of present illness: 36 y.o. [id number] who presented for annual exam after not having had GYN exams/Pap smears for number of years with Pap smear suspicious for squamous cell carcinoma. Follow up colposcopy with biopsies showed carcinoma in situ in 4 quadrant biopsies as well as her ECC.  Subsequent LEEP 06/2016 showed carcinoma in situ with extension into the endocervical glands. Comment noted due to the extension into the endocervical glands superficial invasion could not be definitely excluded.  Consultation with gynecologic oncologist Dr. Andrey Farmer as noted in the 07/08/2016 telephone note that options include Cone biopsy versus simple hysterectomy which would be an option but if on review of the final pathology significant disease is encountered that she may need a second procedure to include radical parametrectomy/radiation possible lymph node sampling. In discussion with the patient future fertility is not an issue and she would like to avoid the Cone biopsy as a second procedure and wants to proceed directly to the simple hysterectomy clearly understanding the issues and risks of needing further evaluation and treatment if significant pathology is encountered beyond carcinoma in situ. 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 She is at risk for vaginal dysplasia in the future will need to be followed for this.  Patient is admitted for LAVH bilateral salpingectomies.  Past medical history,surgical history, medications, allergies, family history and social history were all reviewed and documented in the EPIC chart.  ROS:  Was performed and pertinent positives and negatives are included in the history of present illness.  Exam:  Kennon Portela assistant Vitals:   08/14/16 1219  BP: 130/82   General: well developed, well  nourished female, no acute distress HEENT: normal  Lungs: clear to auscultation without wheezing, rales or rhonchi  Cardiac: regular rate without rubs, murmurs or gallops  Abdomen: soft, nontender without masses, guarding, rebound, organomegaly  Pelvic: external bus vagina: normal   Cervix: grossly normal  Uterus: normal size, midline and mobile, nontender  Adnexa: without masses or tenderness    Assessment/Plan:  35 y.o. [id number] With history as above admitted for laparoscopic-assisted vaginal hysterectomy bilateral salpingectomies. I reviewed the proposed surgery with her to include the expected intraoperative and postoperative courses as well as the recovery period. The patient understands that at any time during the procedure I may convert from a laparoscopic approach to an open TAH if complications or difficulties are encountered during the laparoscopic approach with a bigger incision and a longer recovery period. The absolute and irreversible sterility associated with hysterectomy was discussed and again we discussed and offered a more conservative approach to include cone biopsy to preserve fertility but she wants to proceed with the hysterectomy accepting the sterility. Sexuality following hysterectomy was also reviewed and the potential for persistent dyspareunia and orgasmic dysfunction was discussed. The ovarian conservation issue was reviewed. She has no strong family history of ovarian cancer. Given her young age the benefits of ovarian conservation for ongoing hormone production providing symptom relief, cardiovascular and bone health benefits were reviewed. The risks of ovarian disease in the future up to including ovarian cancer with ovarian conservation discussed. The patient wants to keep both ovaries accepting the risks of ovarian disease. She does give me permission to remove one or both ovaries at the time of surgery if significant pathology is encountered. The risks of infection,  prolonged antibiotics, reoperation for abscess or hematoma formation was discussed. The risks of hemorrhage necessitating transfusion and the risks of transfusion reaction, hepatitis, HIV, mad cow disease and other unknown entities was also discussed. Incisional complications to include opening and draining of incisions and closure by secondary intention, dehiscence and long-term issues of keloid/cosmetics and hernia formation were reviewed. The risk of inadvertent injury to internal organs including bowel, bladder, ureters, vessels, nerves either immediately recognized or delay recognized necessitating major exploratory reparative surgeries and future reparative surgeries including bowel resection, ostomy formation, bladder repair, ureteral damage repair was discussed with her. The patient's questions were answered to her satisfaction and she is ready to proceed with surgery.    Monique Wells,Monique Wells P MD, 12:51 PM 08/14/2016

## 2016-08-14 NOTE — Telephone Encounter (Signed)
Patient advised that Dr. Velvet BatheF wants her to have Pyridium to take one hs night before surgery and one am of surgery with a sip of water.  Advised her it will just be used for a contrast to color the urine.  Rx sent.

## 2016-08-21 NOTE — Patient Instructions (Addendum)
Your procedure is scheduled on: Tuesday, June 19  [facility] through [facility] at: 6 am  Pick up the phone at the desk and dial [phone].  Call this number if you have problems the morning of surgery: [phone].  Remember: Do NOT eat or drink (including water) after midnight Monday  Take these medicines the morning of surgery with a SIP OF WATER: pyridium, lyrica, klonopin and claritin if needed  Bring albuterol inhaler with you on day of surgery.  Do not smoke on day of surgery.  Stop all herbal medications and supplements at this time.  Do NOT wear jewelry (body piercing), metal hair clips/bobby pins, make-up, or nail polish. Do NOT wear lotions, powders, or perfumes.  You may wear deoderant. Do NOT shave for 48 hours prior to surgery. Do NOT bring valuables to the hospital. Contacts may not be worn into surgery.  Leave suitcase in car.  After surgery it may be brought to your room.  For patients admitted to the hospital, checkout time is 11:00 AM the day of discharge. Have a responsible adult drive you home and stay with you for 24 hours after your procedure.  Home with Monique SanesSon Monique Wells cell [phone]

## 2016-08-23 ENCOUNTER — Other Ambulatory Visit (HOSPITAL_COMMUNITY): Payer: BLUE CROSS/BLUE SHIELD

## 2016-08-23 ENCOUNTER — Encounter (HOSPITAL_COMMUNITY): Payer: Self-pay

## 2016-08-23 ENCOUNTER — Encounter (HOSPITAL_COMMUNITY)
Admission: RE | Admit: 2016-08-23 | Discharge: 2016-08-23 | Disposition: A | Discharge status: Home / Self Care | Payer: BLUE CROSS/BLUE SHIELD | Source: Ambulatory Visit | Attending: Gynecology | Admitting: Gynecology

## 2016-08-23 DIAGNOSIS — Z01812 Encounter for preprocedural laboratory examination: Secondary | ICD-10-CM | POA: Diagnosis present

## 2016-08-23 DIAGNOSIS — D069 Carcinoma in situ of cervix, unspecified: Secondary | ICD-10-CM | POA: Diagnosis not present

## 2016-08-23 HISTORY — DX: Low back pain, unspecified: M54.50

## 2016-08-23 HISTORY — DX: Low back pain: M54.5

## 2016-08-23 HISTORY — DX: Other chronic pain: G89.29

## 2016-08-23 HISTORY — DX: Anxiety disorder, unspecified: F41.9

## 2016-08-23 HISTORY — DX: Other seasonal allergic rhinitis: J30.2

## 2016-08-23 HISTORY — DX: Unspecified osteoarthritis, unspecified site: M19.90

## 2016-08-23 LAB — CBC
HEMATOCRIT: 38 % (ref 36.0–46.0)
Hemoglobin: 12.5 g/dL (ref 12.0–15.0)
MCH: 27.6 pg (ref 26.0–34.0)
MCHC: 32.9 g/dL (ref 30.0–36.0)
MCV: 83.9 fL (ref 78.0–100.0)
PLATELETS: 329 10*3/uL (ref 150–400)
RBC: 4.53 MIL/uL (ref 3.87–5.11)
RDW: 14 % (ref 11.5–15.5)
WBC: 7 10*3/uL (ref 4.0–10.5)

## 2016-08-27 ENCOUNTER — Ambulatory Visit (HOSPITAL_COMMUNITY): Payer: BLUE CROSS/BLUE SHIELD | Admitting: Anesthesiology

## 2016-08-27 ENCOUNTER — Encounter (HOSPITAL_COMMUNITY): Payer: Self-pay | Admitting: *Deleted

## 2016-08-27 ENCOUNTER — Observation Stay (HOSPITAL_COMMUNITY)
Admission: RE | Admit: 2016-08-27 | Discharge: 2016-08-27 | Disposition: A | Discharge status: Home / Self Care | Payer: BLUE CROSS/BLUE SHIELD | Source: Ambulatory Visit | Attending: Gynecology | Admitting: Gynecology

## 2016-08-27 ENCOUNTER — Encounter (HOSPITAL_COMMUNITY)
Admission: RE | Disposition: A | Discharge status: Home / Self Care | Payer: Self-pay | Source: Ambulatory Visit | Attending: Gynecology

## 2016-08-27 DIAGNOSIS — J45909 Unspecified asthma, uncomplicated: Secondary | ICD-10-CM | POA: Insufficient documentation

## 2016-08-27 DIAGNOSIS — F419 Anxiety disorder, unspecified: Secondary | ICD-10-CM | POA: Diagnosis not present

## 2016-08-27 DIAGNOSIS — F17200 Nicotine dependence, unspecified, uncomplicated: Secondary | ICD-10-CM | POA: Diagnosis not present

## 2016-08-27 DIAGNOSIS — D069 Carcinoma in situ of cervix, unspecified: Principal | ICD-10-CM | POA: Diagnosis present

## 2016-08-27 DIAGNOSIS — Z79899 Other long term (current) drug therapy: Secondary | ICD-10-CM | POA: Diagnosis not present

## 2016-08-27 HISTORY — PX: LAPAROSCOPIC ASSISTED VAGINAL HYSTERECTOMY: SHX5398

## 2016-08-27 HISTORY — PX: CYSTOSCOPY: SHX5120

## 2016-08-27 LAB — HCG, SERUM, QUALITATIVE: PREG SERUM: NEGATIVE

## 2016-08-27 SURGERY — HYSTERECTOMY, VAGINAL, LAPAROSCOPY-ASSISTED
Anesthesia: General | Site: Bladder

## 2016-08-27 MED ORDER — LIDOCAINE HCL (CARDIAC) 20 MG/ML IV SOLN
INTRAVENOUS | Status: AC
  Filled 2016-08-27: qty 5

## 2016-08-27 MED ORDER — ONDANSETRON HCL 4 MG/2ML IJ SOLN: 4.0000 mg | Freq: Four times a day (QID) | INTRAMUSCULAR | Status: DC | PRN

## 2016-08-27 MED ORDER — HYDROMORPHONE HCL 1 MG/ML IJ SOLN
INTRAMUSCULAR | Status: DC | PRN
  Administered 2016-08-27 (×2): 1 mg via INTRAVENOUS

## 2016-08-27 MED ORDER — LACTATED RINGERS IR SOLN
Status: DC | PRN
  Administered 2016-08-27: 3000 mL

## 2016-08-27 MED ORDER — HYDROMORPHONE HCL 1 MG/ML IJ SOLN
INTRAMUSCULAR | Status: AC
  Administered 2016-08-27: 0.5 mg via INTRAVENOUS
  Filled 2016-08-27: qty 1

## 2016-08-27 MED ORDER — BUPIVACAINE HCL (PF) 0.25 % IJ SOLN
INTRAMUSCULAR | Status: AC
  Filled 2016-08-27: qty 30

## 2016-08-27 MED ORDER — SUGAMMADEX SODIUM 200 MG/2ML IV SOLN
INTRAVENOUS | Status: DC | PRN
  Administered 2016-08-27: 200 mg via INTRAVENOUS

## 2016-08-27 MED ORDER — DEXTROSE-NACL 5-0.9 % IV SOLN
INTRAVENOUS | Status: DC
  Administered 2016-08-27: 12:00:00 via INTRAVENOUS

## 2016-08-27 MED ORDER — HYDROMORPHONE HCL 1 MG/ML IJ SOLN
INTRAMUSCULAR | Status: AC
  Filled 2016-08-27: qty 1

## 2016-08-27 MED ORDER — LIDOCAINE-EPINEPHRINE 1 %-1:100000 IJ SOLN
INTRAMUSCULAR | Status: AC
  Filled 2016-08-27: qty 1

## 2016-08-27 MED ORDER — DEXAMETHASONE SODIUM PHOSPHATE 10 MG/ML IJ SOLN
INTRAMUSCULAR | Status: DC | PRN
  Administered 2016-08-27: 10 mg via INTRAVENOUS

## 2016-08-27 MED ORDER — OXYCODONE HCL 5 MG PO TABS: 5.0000 mg | ORAL_TABLET | Freq: Once | ORAL | Status: DC | PRN

## 2016-08-27 MED ORDER — LIDOCAINE HCL (CARDIAC) 20 MG/ML IV SOLN
INTRAVENOUS | Status: DC | PRN
  Administered 2016-08-27: 100 mg via INTRAVENOUS

## 2016-08-27 MED ORDER — ROCURONIUM BROMIDE 100 MG/10ML IV SOLN
INTRAVENOUS | Status: DC | PRN
  Administered 2016-08-27: 50 mg via INTRAVENOUS

## 2016-08-27 MED ORDER — FENTANYL CITRATE (PF) 250 MCG/5ML IJ SOLN
INTRAMUSCULAR | Status: AC
  Filled 2016-08-27: qty 5

## 2016-08-27 MED ORDER — OXYCODONE-ACETAMINOPHEN 5-325 MG PO TABS
1.0000 | ORAL_TABLET | ORAL | Status: DC | PRN
  Administered 2016-08-27: 2 via ORAL
  Filled 2016-08-27: qty 2

## 2016-08-27 MED ORDER — SCOPOLAMINE 1 MG/3DAYS TD PT72
1.0000 | MEDICATED_PATCH | Freq: Once | TRANSDERMAL | Status: DC
  Administered 2016-08-27: 1.5 mg via TRANSDERMAL

## 2016-08-27 MED ORDER — HYDROMORPHONE HCL 1 MG/ML IJ SOLN
0.2500 mg | INTRAMUSCULAR | Status: DC | PRN
  Administered 2016-08-27 (×4): 0.5 mg via INTRAVENOUS

## 2016-08-27 MED ORDER — SCOPOLAMINE 1 MG/3DAYS TD PT72
MEDICATED_PATCH | TRANSDERMAL | Status: AC
  Administered 2016-08-27: 1.5 mg via TRANSDERMAL
  Filled 2016-08-27: qty 1

## 2016-08-27 MED ORDER — ROCURONIUM BROMIDE 100 MG/10ML IV SOLN
INTRAVENOUS | Status: AC
  Filled 2016-08-27: qty 1

## 2016-08-27 MED ORDER — ONDANSETRON HCL 4 MG/2ML IJ SOLN
INTRAMUSCULAR | Status: AC
  Filled 2016-08-27: qty 2

## 2016-08-27 MED ORDER — LACTATED RINGERS IV SOLN
INTRAVENOUS | Status: DC
  Administered 2016-08-27 (×3): via INTRAVENOUS

## 2016-08-27 MED ORDER — MEPERIDINE HCL 25 MG/ML IJ SOLN
6.2500 mg | Freq: Once | INTRAMUSCULAR | Status: AC
  Administered 2016-08-27: 6.25 mg via INTRAVENOUS

## 2016-08-27 MED ORDER — PROPOFOL 10 MG/ML IV BOLUS
INTRAVENOUS | Status: DC | PRN
  Administered 2016-08-27: 200 mg via INTRAVENOUS

## 2016-08-27 MED ORDER — ONDANSETRON HCL 4 MG PO TABS: 4.0000 mg | ORAL_TABLET | Freq: Four times a day (QID) | ORAL | Status: DC | PRN

## 2016-08-27 MED ORDER — MEPERIDINE HCL 25 MG/ML IJ SOLN
INTRAMUSCULAR | Status: AC
  Administered 2016-08-27: 6.25 mg via INTRAVENOUS
  Filled 2016-08-27: qty 1

## 2016-08-27 MED ORDER — HYDROMORPHONE HCL 2 MG PO TABS
2.0000 mg | ORAL_TABLET | ORAL | Status: DC | PRN
  Administered 2016-08-27: 2 mg via ORAL
  Filled 2016-08-27: qty 1

## 2016-08-27 MED ORDER — MIDAZOLAM HCL 5 MG/5ML IJ SOLN
INTRAMUSCULAR | Status: DC | PRN
  Administered 2016-08-27: 2 mg via INTRAVENOUS

## 2016-08-27 MED ORDER — KETOROLAC TROMETHAMINE 30 MG/ML IJ SOLN: 30.0000 mg | Freq: Four times a day (QID) | INTRAMUSCULAR | Status: DC

## 2016-08-27 MED ORDER — CEFOTETAN DISODIUM-DEXTROSE 2-2.08 GM-% IV SOLR
2.0000 g | INTRAVENOUS | Status: AC
  Administered 2016-08-27: 2 g via INTRAVENOUS

## 2016-08-27 MED ORDER — MIDAZOLAM HCL 2 MG/2ML IJ SOLN
INTRAMUSCULAR | Status: AC
  Filled 2016-08-27: qty 2

## 2016-08-27 MED ORDER — DEXAMETHASONE SODIUM PHOSPHATE 10 MG/ML IJ SOLN
INTRAMUSCULAR | Status: AC
  Filled 2016-08-27: qty 1

## 2016-08-27 MED ORDER — OXYCODONE-ACETAMINOPHEN 5-325 MG PO TABS: 1.0000 | ORAL_TABLET | ORAL | 0 refills | Status: DC | PRN

## 2016-08-27 MED ORDER — KETOROLAC TROMETHAMINE 30 MG/ML IJ SOLN
INTRAMUSCULAR | Status: AC
  Filled 2016-08-27: qty 1

## 2016-08-27 MED ORDER — KETOROLAC TROMETHAMINE 30 MG/ML IJ SOLN
30.0000 mg | Freq: Four times a day (QID) | INTRAMUSCULAR | Status: DC
  Administered 2016-08-27 (×2): 30 mg via INTRAVENOUS
  Filled 2016-08-27 (×2): qty 1

## 2016-08-27 MED ORDER — DIPHENHYDRAMINE HCL 25 MG PO CAPS: 50.0000 mg | ORAL_CAPSULE | Freq: Four times a day (QID) | ORAL | Status: DC | PRN

## 2016-08-27 MED ORDER — SUGAMMADEX SODIUM 200 MG/2ML IV SOLN
INTRAVENOUS | Status: AC
Start: 2016-08-27 — End: 2016-08-27
  Filled 2016-08-27: qty 2

## 2016-08-27 MED ORDER — ONDANSETRON HCL 4 MG/2ML IJ SOLN
INTRAMUSCULAR | Status: DC | PRN
  Administered 2016-08-27: 4 mg via INTRAVENOUS

## 2016-08-27 MED ORDER — PROPOFOL 10 MG/ML IV BOLUS
INTRAVENOUS | Status: AC
  Filled 2016-08-27: qty 20

## 2016-08-27 MED ORDER — OXYCODONE HCL 5 MG/5ML PO SOLN: 5.0000 mg | Freq: Once | ORAL | Status: DC | PRN

## 2016-08-27 MED ORDER — TRAMADOL HCL 50 MG PO TABS
50.0000 mg | ORAL_TABLET | Freq: Four times a day (QID) | ORAL | Status: DC | PRN
  Administered 2016-08-27: 50 mg via ORAL
  Filled 2016-08-27: qty 1

## 2016-08-27 MED ORDER — BUPIVACAINE HCL (PF) 0.25 % IJ SOLN
INTRAMUSCULAR | Status: DC | PRN
  Administered 2016-08-27: 8 mL

## 2016-08-27 MED ORDER — FENTANYL CITRATE (PF) 100 MCG/2ML IJ SOLN
INTRAMUSCULAR | Status: AC
  Administered 2016-08-27: 50 ug via INTRAVENOUS
  Filled 2016-08-27: qty 2

## 2016-08-27 MED ORDER — FENTANYL CITRATE (PF) 100 MCG/2ML IJ SOLN
25.0000 ug | INTRAMUSCULAR | Status: DC | PRN
  Administered 2016-08-27 (×2): 50 ug via INTRAVENOUS

## 2016-08-27 MED ORDER — CEFOTETAN DISODIUM-DEXTROSE 2-2.08 GM-% IV SOLR
INTRAVENOUS | Status: AC
  Filled 2016-08-27: qty 50

## 2016-08-27 MED ORDER — FENTANYL CITRATE (PF) 100 MCG/2ML IJ SOLN
INTRAMUSCULAR | Status: DC | PRN
  Administered 2016-08-27: 100 ug via INTRAVENOUS
  Administered 2016-08-27: 150 ug via INTRAVENOUS

## 2016-08-27 MED ORDER — KETOROLAC TROMETHAMINE 30 MG/ML IJ SOLN: 30.0000 mg | Freq: Once | INTRAMUSCULAR | Status: DC

## 2016-08-27 MED ORDER — LIDOCAINE-EPINEPHRINE 1 %-1:100000 IJ SOLN
INTRAMUSCULAR | Status: DC | PRN
  Administered 2016-08-27: 10 mL

## 2016-08-27 MED ORDER — STERILE WATER FOR IRRIGATION IR SOLN
Status: DC | PRN
  Administered 2016-08-27: 1000 mL

## 2016-08-27 MED ORDER — PREGABALIN 75 MG PO CAPS
75.0000 mg | ORAL_CAPSULE | Freq: Three times a day (TID) | ORAL | Status: DC
  Administered 2016-08-27: 75 mg via ORAL
  Filled 2016-08-27: qty 1

## 2016-08-27 MED ORDER — ALBUTEROL SULFATE (2.5 MG/3ML) 0.083% IN NEBU: 2.5000 mg | INHALATION_SOLUTION | Freq: Four times a day (QID) | RESPIRATORY_TRACT | Status: DC | PRN

## 2016-08-27 SURGICAL SUPPLY — 53 items
APL SRG 38 LTWT LNG FL B (MISCELLANEOUS) ×2
APPLICATOR ARISTA FLEXITIP XL (MISCELLANEOUS) ×4 IMPLANT
CABLE HIGH FREQUENCY MONO STRZ (ELECTRODE) IMPLANT
CLOTH BEACON ORANGE TIMEOUT ST (SAFETY) ×4 IMPLANT
CONT PATH 16OZ SNAP LID 3702 (MISCELLANEOUS) ×4 IMPLANT
COVER BACK TABLE 60X90IN (DRAPES) ×4 IMPLANT
COVER MAYO STAND STRL (DRAPES) ×4 IMPLANT
DECANTER SPIKE VIAL GLASS SM (MISCELLANEOUS) ×8 IMPLANT
DRSG OPSITE POSTOP 3X4 (GAUZE/BANDAGES/DRESSINGS) ×4 IMPLANT
DURAPREP 26ML APPLICATOR (WOUND CARE) ×4 IMPLANT
ELECT REM PT RETURN 9FT ADLT (ELECTROSURGICAL) ×4
ELECTRODE REM PT RTRN 9FT ADLT (ELECTROSURGICAL) ×2 IMPLANT
FILTER SMOKE EVAC LAPAROSHD (FILTER) ×4 IMPLANT
GLOVE BIO SURGEON STRL SZ7.5 (GLOVE) ×12 IMPLANT
GLOVE BIOGEL PI IND STRL 6.5 (GLOVE) ×2 IMPLANT
GLOVE BIOGEL PI IND STRL 7.0 (GLOVE) ×4 IMPLANT
GLOVE BIOGEL PI IND STRL 8 (GLOVE) ×4 IMPLANT
GLOVE BIOGEL PI INDICATOR 6.5 (GLOVE) ×2
GLOVE BIOGEL PI INDICATOR 7.0 (GLOVE) ×4
GLOVE BIOGEL PI INDICATOR 8 (GLOVE) ×4
GLOVE ECLIPSE 7.5 STRL STRAW (GLOVE) ×8 IMPLANT
HEMOSTAT ARISTA ABSORB 3G PWDR (MISCELLANEOUS) ×4 IMPLANT
LEGGING LITHOTOMY PAIR STRL (DRAPES) ×4 IMPLANT
NDL MAYO CATGUT SZ4 TPR NDL (NEEDLE) IMPLANT
NEEDLE MAYO CATGUT SZ4 (NEEDLE) IMPLANT
NS IRRIG 1000ML POUR BTL (IV SOLUTION) ×4 IMPLANT
PACK LAVH (CUSTOM PROCEDURE TRAY) ×4 IMPLANT
PACK ROBOTIC GOWN (GOWN DISPOSABLE) ×4 IMPLANT
PACK TRENDGUARD 450 HYBRID PRO (MISCELLANEOUS) IMPLANT
PACK TRENDGUARD 600 HYBRD PROC (MISCELLANEOUS) IMPLANT
PAD OB MATERNITY 4.3X12.25 (PERSONAL CARE ITEMS) ×4 IMPLANT
POUCH LAPAROSCOPIC INSTRUMENT (MISCELLANEOUS) ×8 IMPLANT
PROTECTOR NERVE ULNAR (MISCELLANEOUS) ×8 IMPLANT
SCISSORS LAP 5X35 DISP (ENDOMECHANICALS) IMPLANT
SET CYSTO W/LG BORE CLAMP LF (SET/KITS/TRAYS/PACK) ×4 IMPLANT
SET IRRIG TUBING LAPAROSCOPIC (IRRIGATION / IRRIGATOR) ×4 IMPLANT
SHEARS HARMONIC ACE PLUS 36CM (ENDOMECHANICALS) ×4 IMPLANT
SLEEVE XCEL OPT CAN 5 100 (ENDOMECHANICALS) ×4 IMPLANT
SOLUTION ELECTROLUBE (MISCELLANEOUS) IMPLANT
SUT PLAIN 4 0 FS 2 27 (SUTURE) ×4 IMPLANT
SUT VIC AB 0 CT1 18XCR BRD8 (SUTURE) ×4 IMPLANT
SUT VIC AB 0 CT1 36 (SUTURE) ×4 IMPLANT
SUT VIC AB 0 CT1 8-18 (SUTURE) ×8
SUT VICRYL 0 TIES 12 18 (SUTURE) ×4 IMPLANT
SUT VICRYL 0 UR6 27IN ABS (SUTURE) ×4 IMPLANT
SYR BULB IRRIGATION 50ML (SYRINGE) ×4 IMPLANT
TOWEL OR 17X24 6PK STRL BLUE (TOWEL DISPOSABLE) ×8 IMPLANT
TRAY FOLEY CATH SILVER 14FR (SET/KITS/TRAYS/PACK) ×4 IMPLANT
TRENDGUARD 450 HYBRID PRO PACK (MISCELLANEOUS) ×4
TRENDGUARD 600 HYBRID PROC PK (MISCELLANEOUS)
TROCAR XCEL NON-BLD 11X100MML (ENDOMECHANICALS) ×4 IMPLANT
TROCAR XCEL NON-BLD 5MMX100MML (ENDOMECHANICALS) ×4 IMPLANT
WARMER LAPAROSCOPE (MISCELLANEOUS) ×4 IMPLANT

## 2016-08-27 NOTE — Op Note (Signed)
Monique Wells 01/16/1981 [id number]   Post Operative Note   Date of surgery:  08/27/2016  Pre Op Dx:  Carcinoma in situ of the cervix  Post Op Dx:  Carcinoma in situ of the cervix  Procedure:  Laparoscopic-assisted vaginal hysterectomy, bilateral salpingectomies, cystoscopy  Surgeon:  [name],Amri Lien P  Assistant:  Reynaldo MiniumFernandez, Juan  Anesthesia:  General  EBL:  250 cc anesthesia reported  Complications:  None  Specimen:  Uterus with attached fallopian tubes to pathology  Findings: EUA:  External BUS vagina normal. Cervix grossly normal. Uterus normal size, midline, mobile. Adnexa without masses   Operative:  Anterior cul-de-sac normal. Posterior cul-de-sac normal. Uterus normal size, shape and contour. Right and left fallopian tubes normal length, caliber and fimbriated ends. Right and left ovaries grossly normal, free and mobile. No evidence of pelvic adhesions or endometriosis. Upper abdominal exam shows appendix grossly normal free and mobile. Liver not well visualized due to omentum obscuring.   Cystoscopy: Bladder mucosa appears normal with no evidence of trauma with bilateral ureteral jets demonstrated.  Procedure:  The patient was taken to the operating room, placed in the low dorsal lithotomy position, underwent general anesthesia, received an abdominal preparation with ChloraPrep and a vaginal/perineal preparation with Betadine solution per nursing personnel. The timeout was performed by the surgical team. The EUA was performed and a Hulka tenaculum was placed on the cervix. The patient was draped in the usual fashion. A transverse infraumbilical incision was made and using the 10 mm direct entry trocar the abdomen was directly entered under direct visualization without difficulty and subsequently insufflated. Right and left 5 mm suprapubic ports were then placed under direct visualization after transillumination for the vessels without difficulty. Examination of the pelvic organs and  upper abdominal exam was carried out with findings noted above. The left fallopian tube was elevated, the ureter identified away from the surgical site and using the harmonic scalpel the mesosalpinx was transected to the level of the uterus without difficulty. Attention was given to avoid the infundibulopelvic pedicle. The uterine ovarian pedicle was then transected using the harmonic scalpel as was the round ligament and the parametrial peritoneal reflection was transected to the lower uterine segment and the anterior vesicouterine peritoneal fold was transected to the midline without difficulty all using the harmonic scalpel. A similar procedure was carried out on the other side meeting the vesicouterine peritoneal reflection in the midline. Attention was then turned to the vaginal portion of the procedure and the patient was placed in the high dorsal lithotomy position, the cervix visualized with a weighted speculum after the tenaculum was removed. The cervix was regrasped with a single-tooth tenaculum and a paracervical mucosa was injected using 1% lidocaine with 1-100,000 epinephrine dilution, 10 cc total. The paracervical mucosa was then circumferentially sharply incised and the paracervical planes were sharply developed without difficulty. The anterior cul-de-sac was sharply entered without difficulty as was the posterior cul-de-sac and a long weighted speculum was then placed. The right and left uterosacral ligaments were clamped cut and ligated using 0 Vicryl suture and tagged for future reference. The uterus was then progressively freed from its attachments through clamping, cutting and ligating of the parametrial tissues using 0 Vicryl suture. The uterus was ultimately delivered through the vagina and the remaining attachments were clamped cut and ligated using 0 Vicryl suture and the specimen was sent to pathology with careful attention to nursing personnel to label carcinoma in situ requesting cone  handling of the specimen. The long weighted  speculum was replaced with a shorter weighted speculum, the intestines packed away from the cuff using a tagged tail sponge and and the posterior vaginal cuff mucosa was closed in a running interlocking stitch from uterosacral ligament to uterosacral ligament.  The tail sponge was removed and the vagina was closed anterior to posterior using 0 Vicryl suture in interrupted figure-of-eight stitch. The vagina was irrigated showing adequate hemostasis and the Foley catheter was then removed and cystoscopy was performed. The patient had previously received Pyridium preoperatively and bilateral ureteral jets were quickly visualized and there was no evidence of bladder mucosal injury. The Foley catheter was replaced, the surgical team regloved and regowned, the patient placed in the low dorsal lithotomy position and the abdomen was reinsufflated. Irrigation and subsequently inspection showed adequate hemostasis at all pedicle sites as well as at the bladder flap and the gas was slowly allowed to escape with continued hemostasis visualized under low pressure situation. The suprapubic ports were removed under direct visualization with good hemostasis and the infraumbilical port was packed out under direct visualization showing adequate hemostasis and no evidence of hernia formation. The incision sites were injected using 0.25% Marcaine, 10 cc total and the upper umbilical port was closed using 0 Vicryl suture in an interrupted subcutaneous fascial stitch. The two 5 mm ports were then closed using 4-0 plain suture in interrupted cutaneous stitch and the infraumbilical port was closed using 4-0 plain suture in a running subcuticular stitch.  Dermabond skin adhesive was applied, the patient was subsequently awakened without difficulty and taken to recovery in good condition having tolerated procedure well. The sponge and needle count were verified correct prior to closing the  abdomen.     Dara Lords MD, 9:27 AM 08/27/2016

## 2016-08-27 NOTE — Transfer of Care (Signed)
Immediate Anesthesia Transfer of Care Note  Patient: Monique Wells  Procedure(s) Performed: Procedure(s) with comments: LAPAROSCOPIC ASSISTED VAGINAL HYSTERECTOMY (N/A) - requests 7:30am OR time  Requests 2 1/2 hours  Patient Location: PACU  Anesthesia Type:General  Level of Consciousness: awake, alert  and oriented  Airway & Oxygen Therapy: Patient Spontanous Breathing and Patient connected to nasal cannula oxygen  Post-op Assessment: Report given to RN and Post -op Vital signs reviewed and stable  Post vital signs: Reviewed and stable  Last Vitals:  Vitals:   08/27/16 0615  BP: 127/83  Pulse: 80  Resp: 16  Temp: 36.7 C    Last Pain:  Vitals:   08/27/16 0615  TempSrc: Oral      Patients Stated Pain Goal: 3 (08/27/16 0615)  Complications: No apparent anesthesia complications

## 2016-08-27 NOTE — H&P (Signed)
The patient was examined.  I reviewed the proposed surgery and consent form with the patient.  The dictated history and physical is current and accurate and all questions were answered. The patient is ready to proceed with surgery and has a realistic understanding and expectation for the outcome.   [name],TIMOTHY P MD, 7:06 AM 08/27/2016

## 2016-08-27 NOTE — Anesthesia Preprocedure Evaluation (Signed)
Anesthesia Evaluation  Patient identified by MRN, date of birth, ID band Patient awake    Reviewed: Allergy & Precautions, H&P , NPO status , Patient's Chart, lab work & pertinent test results  Airway Mallampati: II   Neck ROM: full    Dental   Pulmonary asthma , Current Smoker,    breath sounds clear to auscultation       Cardiovascular negative cardio ROS   Rhythm:regular Rate:Normal     Neuro/Psych PSYCHIATRIC DISORDERS Anxiety    GI/Hepatic   Endo/Other    Renal/GU      Musculoskeletal  (+) Arthritis ,   Abdominal   Peds  Hematology   Anesthesia Other Findings   Reproductive/Obstetrics                             Anesthesia Physical Anesthesia Plan  ASA: II  Anesthesia Plan: General   Post-op Pain Management:    Induction: Intravenous  PONV Risk Score and Plan: 4 or greater and Ondansetron, Dexamethasone, Propofol, Midazolam and Scopolamine patch - Pre-op  Airway Management Planned: Oral ETT  Additional Equipment:   Intra-op Plan:   Post-operative Plan: Extubation in OR  Informed Consent: I have reviewed the patients History and Physical, chart, labs and discussed the procedure including the risks, benefits and alternatives for the proposed anesthesia with the patient or authorized representative who has indicated his/her understanding and acceptance.     Plan Discussed with: CRNA, Anesthesiologist and Surgeon  Anesthesia Plan Comments:         Anesthesia Quick Evaluation

## 2016-08-27 NOTE — Progress Notes (Signed)
Monique Wells 01/31/1981 [id number]   Day of Surgery Procedure(s) (LRB): LAPAROSCOPIC ASSISTED VAGINAL HYSTERECTOMY (N/A) CYSTOSCOPY  Subjective: Patient reports feels well, no acute distress, pain severity reported mild, Yes.   taking PO, foley catheter out, Yes.   voiding, Yes.   ambulating, No. passing flatus  Objective: Vital signs in last 24 hours: Temp:  [97.7 F (36.5 C)-98 F (36.7 C)] 97.8 F (36.6 C) (06/19 1615) Pulse Rate:  [69-91] 81 (06/19 1615) Resp:  [8-22] 16 (06/19 1615) BP: (127-153)/(73-104) 132/73 (06/19 1615) SpO2:  [95 %-98 %] 95 % (06/19 1615) Weight:  [224 lb (101.6 kg)] 224 lb (101.6 kg) (06/19 1135) Last BM Date: 08/27/16    EXAM General: awake, alert and no distress Resp: clear to auscultation bilaterally Cardio: regular rate and rhythm GI: soft, minimal tender, bowel sounds present, incisions dry intact Lower Extremities: Without swelling or tenderness Vaginal Bleeding: Reported scant    Assessment: s/p Procedure(s): LAPAROSCOPIC ASSISTED VAGINAL HYSTERECTOMY CYSTOSCOPY: Progressing well, patient requesting discharge.  Ambulating, drinking, voiding, good pain relief on PO pain meds.  Understands early for discharge but feels comfortable going home  Plan: Discharge home today.  Precautions, instructions and follow up were discussed with the patient.  Prescriptions provided per AVS.  Patient to call the office to arrange a post-operative appointmant in 2 weeks.    [name],Ratasha Fabre P MD, 4:56 PM 08/27/2016

## 2016-08-27 NOTE — Anesthesia Procedure Notes (Signed)
Procedure Name: Intubation Date/Time: 08/27/2016 7:28 AM Performed by: Junious SilkGILBERT, Jaz Laningham Pre-anesthesia Checklist: Patient identified, Emergency Drugs available, Suction available, Patient being monitored and Timeout performed Patient Re-evaluated:Patient Re-evaluated prior to inductionOxygen Delivery Method: Circle system utilized Preoxygenation: Pre-oxygenation with 100% oxygen Intubation Type: IV induction Ventilation: Mask ventilation without difficulty Laryngoscope Size: Miller and 2 Grade View: Grade II Tube type: Oral Tube size: 7.0 mm Number of attempts: 1 Airway Equipment and Method: Bougie stylet Placement Confirmation: ETT inserted through vocal cords under direct vision,  positive ETCO2,  CO2 detector and breath sounds checked- equal and bilateral Secured at: 22 cm Tube secured with: Tape Dental Injury: Teeth and Oropharynx as per pre-operative assessment

## 2016-08-27 NOTE — Progress Notes (Signed)
Discharge teaching complete with pt. Pt understood all information and did not have any questions. Pt ambulated out of the hospital and discharged home to family.  

## 2016-08-27 NOTE — Anesthesia Postprocedure Evaluation (Signed)
Anesthesia Post Note  Patient: Monique Wells  Procedure(s) Performed: Procedure(s) (LRB): LAPAROSCOPIC ASSISTED VAGINAL HYSTERECTOMY (N/A) CYSTOSCOPY     Patient location during evaluation: [facility] Anesthesia Type: General Level of consciousness: awake and alert Pain management: pain level controlled Vital Signs Assessment: post-procedure vital signs reviewed and stable Respiratory status: spontaneous breathing, nonlabored ventilation, respiratory function stable and patient connected to nasal cannula oxygen Cardiovascular status: blood pressure returned to baseline and stable Postop Assessment: no signs of nausea or vomiting Anesthetic complications: no    Last Vitals:  Vitals:   08/27/16 1230 08/27/16 1615  BP: (!) 153/78 132/73  Pulse: 78 81  Resp: 18 16  Temp: 36.6 C 36.6 C    Last Pain:  Vitals:   08/27/16 1624  TempSrc:   PainSc: 4    Pain Goal: Patients Stated Pain Goal: 4 (08/27/16 1624)               Marrion CoyMERRITT,Dariane Natzke

## 2016-08-27 NOTE — Discharge Instructions (Signed)
°  Postoperative Instructions Hysterectomy ° °Dr. Muhannad Bignell and the nursing staff have discussed postoperative instructions with you.  If you have any questions please ask them before you leave the hospital, or call Dr Amelya Mabry’s office at [phone].   ° °We would like to emphasize the following instructions: ° ° °  Call the office to make your follow-up appointment as recommended by Dr Sreenidhi Ganson (usually 2 weeks). ° °  You were given a prescription, or one was ordered for you at the pharmacy you designated.  Get that prescription filled and take the medication according to instructions. ° °  You may eat a regular diet, but slowly until you start having bowel movements. ° °  Drink plenty of water daily. ° °  Nothing in the vagina (intercourse, douching, objects of any kind) until released by Dr Aileen Amore. ° °  No driving for two weeks.  Wait to be cleared by Dr Kullen Tomasetti at your first post op check.  Car rides (short) are ok after several days at home, as long as you are not having significant pain, but no traveling out of town. ° °  You may shower, but no baths.  Walking up and down stairs is ok.  No heavy lifting, prolonged standing, repeated bending or any “working out” until your first post op check. ° °  Rest frequently, listen to your body and do not push yourself and overdo it. ° °  Call if: ° °o Your pain medication does not seem strong enough. °o Worsening pain or abdominal bloating °o Persistent nausea or vomiting °o Difficulty with urination or bowel movements. °o Temperature of 101 degrees or higher. °o Bleeding heavier then staining (clots or period type flow). °o Incisions become red, tender or begin to drain. °o You have any questions or concerns. °

## 2016-08-28 ENCOUNTER — Telehealth: Payer: Self-pay

## 2016-08-28 ENCOUNTER — Encounter (HOSPITAL_COMMUNITY): Payer: Self-pay | Admitting: Gynecology

## 2016-08-28 MED ORDER — OXYCODONE-ACETAMINOPHEN 7.5-325 MG PO TABS: ORAL_TABLET | ORAL | 0 refills | Status: DC

## 2016-08-28 NOTE — Telephone Encounter (Signed)
Okay for Percocet 7.5 #20 one to two Q4 to 6 hours when necessary pain.  Also make sure that Monique Wells is resting and not doing too much activities. I do want her up and moving but not too much over the first couple days.

## 2016-08-28 NOTE — Telephone Encounter (Signed)
Patient advised to take it easy. Up and moving is good just not too much activity.  Rx will be at front desk after 2pm for pick up.

## 2016-08-28 NOTE — Discharge Summary (Signed)
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  Hospital Course:  Patient underwent an uncomplicated laparoscopic-assisted vaginal hysterectomy and bilateral salpingectomies with normal cystoscopy following 08/27/2016. She was discharged the evening of the surgery per her request ambulating well, voiding without difficulty, drinking and eating without difficulty with good pain relief on oral medications. The patient received instructions for postoperative care and call precautions.  She received prescriptions per AVS and will be seen in the office 2 weeks following discharge.       [name],Jaana Brodt P MD, 4:22 PM 08/28/2016

## 2016-08-28 NOTE — Telephone Encounter (Signed)
Patient called stating she is in a lot of pain after hysterectomy yesterday.  She said she is not able to sleep she is in so much pain.  She said you prescribed Oxycodone "but only the 5" and she wondered if you might prescribe her a higher dose.   Pain is mid pelvis. No UTI symptoms.

## 2016-08-28 NOTE — Telephone Encounter (Signed)
Encounter already started

## 2016-09-10 ENCOUNTER — Encounter: Payer: Self-pay | Admitting: Gynecology

## 2016-09-10 ENCOUNTER — Ambulatory Visit (INDEPENDENT_AMBULATORY_CARE_PROVIDER_SITE_OTHER): Payer: BLUE CROSS/BLUE SHIELD | Admitting: Gynecology

## 2016-09-10 VITALS — BP 122/80

## 2016-09-10 DIAGNOSIS — R102 Pelvic and perineal pain: Secondary | ICD-10-CM

## 2016-09-10 DIAGNOSIS — Z9889 Other specified postprocedural states: Secondary | ICD-10-CM

## 2016-09-10 LAB — URINALYSIS W MICROSCOPIC + REFLEX CULTURE
BILIRUBIN URINE: NEGATIVE
CASTS: NONE SEEN [LPF]
Crystals: NONE SEEN [HPF]
Glucose, UA: NEGATIVE
Ketones, ur: NEGATIVE
LEUKOCYTES UA: NEGATIVE
NITRITE: NEGATIVE
PH: 5.5 (ref 5.0–8.0)
Protein, ur: NEGATIVE
SPECIFIC GRAVITY, URINE: 1.025 (ref 1.001–1.035)
Yeast: NONE SEEN [HPF]

## 2016-09-10 NOTE — Addendum Note (Signed)
Addended by: Dayna BarkerGARDNER, KIMBERLY K on: 09/10/2016 03:07 PM   Modules accepted: Orders

## 2016-09-10 NOTE — Progress Notes (Signed)
    Monique LundBrie L Wells 06/02/1980 [id number]        35 y.o.  [id number] presents for two-week postoperative visit status post LAVH bilateral salpingectomies for carcinoma in situ of the cervix. Is doing well without complaints.  Past medical history,surgical history, problem list, medications, allergies, family history and social history were all reviewed and documented in the EPIC chart.  Directed ROS with pertinent positives and negatives documented in the history of present illness/assessment and plan.  Exam: Kennon PortelaKim Gardner assistant Vitals:   09/10/16 1358  BP: 122/80   General appearance:  Normal Abdomen soft nontender without masses guarding rebound. Incision sites well healed. Pelvic external BUS vagina with cuff intact nontender. Bimanual without masses or tenderness   Assessment/Plan:  36 y.o. [id number] was normal postoperative visit status post LAVH bilateral salpingectomies for carcinoma in situ. Reviewed pathology which showed her some in situ but no evidence of invasion. Will slowly resume activities with the exception of continued pelvic rest. Patient will follow up in 2 weeks for her next postoperative visit with Dr. Lily PeerFernandez as I will be out of the office and patient's comfortable with this.   [name],Lyall Faciane P MD, 2:53 PM 09/10/2016

## 2016-09-10 NOTE — Patient Instructions (Signed)
Follow up in 2 weeks for your next postoperative visit with Dr. Lily PeerFernandez

## 2016-09-12 ENCOUNTER — Encounter: Payer: Self-pay | Admitting: Gynecology

## 2016-09-12 LAB — URINE CULTURE: ORGANISM ID, BACTERIA: NO GROWTH

## 2016-09-24 ENCOUNTER — Ambulatory Visit (INDEPENDENT_AMBULATORY_CARE_PROVIDER_SITE_OTHER): Payer: BLUE CROSS/BLUE SHIELD | Admitting: Gynecology

## 2016-09-24 ENCOUNTER — Encounter: Payer: Self-pay | Admitting: Gynecology

## 2016-09-24 VITALS — BP 118/76

## 2016-09-24 DIAGNOSIS — Z09 Encounter for follow-up examination after completed treatment for conditions other than malignant neoplasm: Secondary | ICD-10-CM

## 2016-09-24 NOTE — Progress Notes (Signed)
   Patient is a 36 year old who presented to the office today for her 4 week postop visit. On July 3 patient came for her first postop appointment seen by my partner who was the primary surgeon the case and she was doing well. Patient had an LAVH bilateral salpingectomies for carcinoma in situ of the cervix and was without any complaints. She is here today after 4 weeks of her surgery because [facility] [place] and will not be of a return until 6 weeks.  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  Patient without any GI or GU complaints. Otherwise doing well.  Exam: Abdomen: Port sites completely healed abdomen soft nontender no rebound or guarding Pelvic: Bartholin urethra Skene was within normal limits Vagina: No lesions or discharge vaginal cuff intact suture material still present Bimanual exam no palpable masses or tenderness Rectal exam not done  Assessment/plan: Patient 4 weeks status post laparoscopic-assisted vaginal hysterectomy with bilateral salpingectomy as a result of carcinoma in situ as described above. It was recommended patient wait 2 more weeks before she skipped completely some urge underwater to avoid vaginal cuff coming loose cystic material was still present. After 2 more week she can return to full normal activity.

## 2016-09-27 ENCOUNTER — Telehealth: Payer: Self-pay | Admitting: *Deleted

## 2016-09-27 MED ORDER — METRONIDAZOLE 500 MG PO TABS: ORAL_TABLET | ORAL | 0 refills | Status: DC

## 2016-09-27 NOTE — Telephone Encounter (Signed)
Pt called had post op on 09/24/16 post LAVH c/o diarrhea daily, has tried Pepto-Bismol,  Imodium and no relief, no vomiting, able to drink fluids, no fever. States she has stomach/pelvic cramping but only noticed when diarrhea started, said cramping is a # 6 pain level.  She asked do you have any other recommendations to help with diarrhea?

## 2016-09-27 NOTE — Telephone Encounter (Signed)
Call in prescription for Flagyl 500 mg one by mouth 3 times a day for 10 days. Her diarrhea may be a result of the antibiotic that she had received during her surgery. Gave her GI rest for 24 hour stay on liquids. I would encourage taking Gatorade as well to maintain her electrolytes. Then gradually increase her diet. If no improvement in the next 48-70 2R she needs to report to the emergency room.

## 2016-09-27 NOTE — Telephone Encounter (Signed)
Pt informed, Rx sent. 

## 2017-02-06 ENCOUNTER — Other Ambulatory Visit: Payer: Self-pay | Admitting: Nephrology

## 2017-02-06 DIAGNOSIS — Z1231 Encounter for screening mammogram for malignant neoplasm of breast: Secondary | ICD-10-CM

## 2017-03-07 ENCOUNTER — Ambulatory Visit: Payer: BLUE CROSS/BLUE SHIELD

## 2017-03-21 ENCOUNTER — Encounter: Payer: Self-pay | Admitting: Gynecology

## 2017-04-28 ENCOUNTER — Encounter: Payer: BLUE CROSS/BLUE SHIELD | Admitting: Gynecology

## 2018-12-01 ENCOUNTER — Encounter: Payer: Self-pay | Admitting: Gynecology

## 2019-12-15 DIAGNOSIS — I10 Essential (primary) hypertension: Secondary | ICD-10-CM | POA: Diagnosis not present

## 2020-02-25 DIAGNOSIS — I10 Essential (primary) hypertension: Secondary | ICD-10-CM | POA: Diagnosis not present

## 2020-03-06 DIAGNOSIS — U071 COVID-19: Secondary | ICD-10-CM | POA: Diagnosis not present

## 2020-03-22 DIAGNOSIS — R0609 Other forms of dyspnea: Secondary | ICD-10-CM | POA: Insufficient documentation

## 2020-03-22 DIAGNOSIS — R002 Palpitations: Secondary | ICD-10-CM | POA: Insufficient documentation

## 2020-03-22 DIAGNOSIS — R609 Edema, unspecified: Secondary | ICD-10-CM | POA: Insufficient documentation

## 2020-03-22 DIAGNOSIS — F17200 Nicotine dependence, unspecified, uncomplicated: Secondary | ICD-10-CM | POA: Insufficient documentation

## 2020-03-22 DIAGNOSIS — R079 Chest pain, unspecified: Secondary | ICD-10-CM | POA: Diagnosis not present

## 2020-03-22 DIAGNOSIS — R0600 Dyspnea, unspecified: Secondary | ICD-10-CM | POA: Diagnosis not present

## 2020-04-28 DIAGNOSIS — I10 Essential (primary) hypertension: Secondary | ICD-10-CM | POA: Diagnosis not present

## 2020-06-01 DIAGNOSIS — R079 Chest pain, unspecified: Secondary | ICD-10-CM | POA: Diagnosis not present

## 2020-06-01 DIAGNOSIS — R002 Palpitations: Secondary | ICD-10-CM | POA: Diagnosis not present

## 2020-06-27 DIAGNOSIS — I10 Essential (primary) hypertension: Secondary | ICD-10-CM | POA: Diagnosis not present

## 2020-06-27 DIAGNOSIS — J069 Acute upper respiratory infection, unspecified: Secondary | ICD-10-CM | POA: Diagnosis not present

## 2020-07-19 DIAGNOSIS — R002 Palpitations: Secondary | ICD-10-CM | POA: Diagnosis not present

## 2020-07-19 DIAGNOSIS — R079 Chest pain, unspecified: Secondary | ICD-10-CM | POA: Diagnosis not present

## 2020-09-19 DIAGNOSIS — I10 Essential (primary) hypertension: Secondary | ICD-10-CM | POA: Diagnosis not present

## 2020-10-13 DIAGNOSIS — S51831A Puncture wound without foreign body of right forearm, initial encounter: Secondary | ICD-10-CM | POA: Diagnosis not present

## 2020-11-24 DIAGNOSIS — I10 Essential (primary) hypertension: Secondary | ICD-10-CM | POA: Diagnosis not present

## 2020-11-30 DIAGNOSIS — F4310 Post-traumatic stress disorder, unspecified: Secondary | ICD-10-CM | POA: Diagnosis not present

## 2020-11-30 DIAGNOSIS — F411 Generalized anxiety disorder: Secondary | ICD-10-CM | POA: Diagnosis not present

## 2020-11-30 DIAGNOSIS — F909 Attention-deficit hyperactivity disorder, unspecified type: Secondary | ICD-10-CM | POA: Diagnosis not present

## 2021-02-22 DIAGNOSIS — F411 Generalized anxiety disorder: Secondary | ICD-10-CM | POA: Diagnosis not present

## 2021-02-22 DIAGNOSIS — F4310 Post-traumatic stress disorder, unspecified: Secondary | ICD-10-CM | POA: Diagnosis not present

## 2021-03-13 DIAGNOSIS — K0889 Other specified disorders of teeth and supporting structures: Secondary | ICD-10-CM | POA: Diagnosis not present

## 2021-03-22 DIAGNOSIS — F411 Generalized anxiety disorder: Secondary | ICD-10-CM | POA: Diagnosis not present

## 2021-03-22 DIAGNOSIS — F900 Attention-deficit hyperactivity disorder, predominantly inattentive type: Secondary | ICD-10-CM | POA: Diagnosis not present

## 2021-05-16 DIAGNOSIS — L821 Other seborrheic keratosis: Secondary | ICD-10-CM | POA: Diagnosis not present

## 2021-05-16 DIAGNOSIS — L814 Other melanin hyperpigmentation: Secondary | ICD-10-CM | POA: Diagnosis not present

## 2021-09-19 ENCOUNTER — Ambulatory Visit: Payer: BLUE CROSS/BLUE SHIELD | Admitting: Physician Assistant

## 2021-10-01 ENCOUNTER — Encounter: Payer: Self-pay | Admitting: Physician Assistant

## 2021-10-01 ENCOUNTER — Ambulatory Visit (INDEPENDENT_AMBULATORY_CARE_PROVIDER_SITE_OTHER): Payer: No Typology Code available for payment source | Admitting: Physician Assistant

## 2021-10-01 VITALS — BP 146/84 | HR 76 | Ht 66.93 in | Wt 226.0 lb

## 2021-10-01 DIAGNOSIS — U099 Post covid-19 condition, unspecified: Secondary | ICD-10-CM | POA: Diagnosis not present

## 2021-10-01 DIAGNOSIS — Z720 Tobacco use: Secondary | ICD-10-CM

## 2021-10-01 DIAGNOSIS — F418 Other specified anxiety disorders: Secondary | ICD-10-CM | POA: Insufficient documentation

## 2021-10-01 DIAGNOSIS — M5416 Radiculopathy, lumbar region: Secondary | ICD-10-CM | POA: Insufficient documentation

## 2021-10-01 DIAGNOSIS — D069 Carcinoma in situ of cervix, unspecified: Secondary | ICD-10-CM

## 2021-10-01 DIAGNOSIS — G894 Chronic pain syndrome: Secondary | ICD-10-CM | POA: Diagnosis not present

## 2021-10-01 DIAGNOSIS — Z7689 Persons encountering health services in other specified circumstances: Secondary | ICD-10-CM

## 2021-10-01 MED ORDER — TRAMADOL HCL 50 MG PO TABS: 50.0000 mg | ORAL_TABLET | Freq: Three times a day (TID) | ORAL | 0 refills | Status: DC | PRN

## 2021-10-01 MED ORDER — METHOCARBAMOL 500 MG PO TABS: 500.0000 mg | ORAL_TABLET | Freq: Three times a day (TID) | ORAL | 1 refills | Status: DC | PRN

## 2021-10-01 MED ORDER — PREGABALIN 150 MG PO CAPS: 150.0000 mg | ORAL_CAPSULE | Freq: Three times a day (TID) | ORAL | 0 refills | Status: DC

## 2021-10-01 NOTE — Assessment & Plan Note (Addendum)
-  Will request PCP records. Reviewed neurology records from [facility] from 2017. Patient with hx of lumbar radiculopathy, DDD, and small bulge at L5-S1. Patient previously on Gabapentin which was changed to Lyrica. -On Lyrica 150 mg TID, provided refill.  -Will continue to monitor.

## 2021-10-01 NOTE — Patient Instructions (Signed)
Neuropathic Pain Neuropathic pain is pain caused by damage to the nerves that are responsible for certain sensations in your body (sensory nerves). Neuropathic pain can make you more sensitive to pain. Even a minor sensation can feel very painful. This is usually a long-term (chronic) condition that can be difficult to treat. The type of pain differs from person to person. It may: Start suddenly (acute), or it may develop slowly and become chronic. Come and go as damaged nerves heal, or it may stay at the same level for years. Cause emotional distress, loss of sleep, and a lower quality of life. What are the causes? The most common cause of this condition is diabetes. Many other diseases and conditions can also cause neuropathic pain. Causes of neuropathic pain can be classified as: Toxic. This is caused by medicines and chemicals. The most common causes of toxic neuropathic pain is damage from medicines that kill cancer cells (chemotherapy) or alcohol abuse. Metabolic. This can be caused by: Diabetes. Lack of vitamins like B12. Traumatic. Any injury that cuts, crushes, or stretches a nerve can cause damage and pain. Compression-related. If a sensory nerve gets trapped or compressed for a long period of time, the blood supply to the nerve can be cut off. Vascular. Many blood vessel diseases can cause neuropathic pain by decreasing blood supply and oxygen to nerves. Autoimmune. This type of pain results from diseases in which the body's defense system (immune system) mistakenly attacks sensory nerves. Examples of autoimmune diseases that can cause neuropathic pain include lupus and multiple sclerosis. Infectious. Many types of viral infections can damage sensory nerves and cause pain. Shingles infection is a common cause of this type of pain. Inherited. Neuropathic pain can be a symptom of many diseases that are passed down through families (genetic). What increases the risk? You are more likely to  develop this condition if: You have diabetes. You smoke. You drink too much alcohol. You are taking certain medicines, including chemotherapy or medicines that treat immune system disorders. What are the signs or symptoms? The main symptom is pain. Neuropathic pain is often described as: Burning. Shock-like. Stinging. Hot or cold. Itching. How is this diagnosed? No single test can diagnose neuropathic pain. It is diagnosed based on: A physical exam and your symptoms. Your health care provider will ask you about your pain. You may be asked to use a pain scale to describe how bad your pain is. Tests. These may be done to see if you have a cause and location of any nerve damage. They include: Nerve conduction studies and electromyography to test how well nerve signals travel through your nerves and muscles (electrodiagnostic testing). Skin biopsy to evaluate for small fiber neuropathy. Imaging studies, such as: X-rays. CT scan. MRI. How is this treated? Treatment for neuropathic pain may change over time. You may need to try different treatment options or a combination of treatments. Some options include: Treating the underlying cause of the neuropathy, such as diabetes, kidney disease, or vitamin deficiencies. Stopping medicines that can cause neuropathy, such as chemotherapy. Medicine to relieve pain. Medicines may include: Prescription or over-the-counter pain medicine. Anti-seizure medicine. Antidepressant medicines. Pain-relieving patches or creams that are applied to painful areas of skin. A medicine to numb the area (local anesthetic), which can be injected as a nerve block. Transcutaneous nerve stimulation. This uses electrical currents to block painful nerve signals. The treatment is painless. Alternative treatments, such as: Acupuncture. Meditation. Massage. Occupational or physical therapy. Pain management programs. Counseling. Follow   these instructions at  home: Medicines  Take over-the-counter and prescription medicines only as told by your health care provider. Ask your health care provider if the medicine prescribed to you: Requires you to avoid driving or using machinery. Can cause constipation. You may need to take these actions to prevent or treat constipation: Drink enough fluid to keep your urine pale yellow. Take over-the-counter or prescription medicines. Eat foods that are high in fiber, such as beans, whole grains, and fresh fruits and vegetables. Limit foods that are high in fat and processed sugars, such as fried or sweet foods. Lifestyle  Have a good support system at home. Consider joining a chronic pain support group. Do not use any products that contain nicotine or tobacco. These products include cigarettes, chewing tobacco, and vaping devices, such as e-cigarettes. If you need help quitting, ask your health care provider. Do not drink alcohol. General instructions Learn as much as you can about your condition. Work closely with all your health care providers to find the treatment plan that works best for you. Ask your health care provider what activities are safe for you. Keep all follow-up visits. This is important. Contact a health care provider if: Your pain treatments are not working. You are having side effects from your medicines. You are struggling with tiredness (fatigue), mood changes, depression, or anxiety. Get help right away if: You have thoughts of hurting yourself. Get help right away if you feel like you may hurt yourself or others, or have thoughts about taking your own life. Go to your nearest emergency room or: Call 911. Call the National Suicide Prevention Lifeline at 1-800-273-8255 or 988. This is open 24 hours a day. Text the Crisis Text Line at 741741. Summary Neuropathic pain is pain caused by damage to the nerves that are responsible for certain sensations in your body (sensory  nerves). Neuropathic pain may come and go as damaged nerves heal, or it may stay at the same level for years. Neuropathic pain is usually a long-term condition that can be difficult to treat. Consider joining a chronic pain support group. This information is not intended to replace advice given to you by your health care provider. Make sure you discuss any questions you have with your health care provider. Document Revised: 10/23/2020 Document Reviewed: 10/23/2020 Elsevier Patient Education  2023 [employer]  

## 2021-10-01 NOTE — Assessment & Plan Note (Signed)
-  S/p vaginal hysterectomy with bilateral salpingectomy.  -Reviewed last OB/GYN consult from July 2018. Advised patient to follow-up with OB/GYN.

## 2021-10-01 NOTE — Assessment & Plan Note (Signed)
-  Discussed with patient controlled substance policy for benzodiazepine therapy which involves 2 refills per year. Patient verbalized understanding. PDMP reviewed, clonazepam 1 mg last filled 03/08/2021 for Q: 90 tablets. I am agreeable to 0.5 mg daily as needed for anxiety Q: 20 tablets which should last ~6 months. Controlled substance contract obtained. Will continue to monitor.
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
## 2021-10-11 ENCOUNTER — Other Ambulatory Visit: Payer: Self-pay | Admitting: Physician Assistant

## 2021-10-11 DIAGNOSIS — G894 Chronic pain syndrome: Secondary | ICD-10-CM

## 2021-10-15 ENCOUNTER — Telehealth: Payer: Self-pay | Admitting: Nephrology

## 2021-10-15 DIAGNOSIS — G894 Chronic pain syndrome: Secondary | ICD-10-CM

## 2021-10-15 MED ORDER — TRAMADOL HCL 50 MG PO TABS: 50.0000 mg | ORAL_TABLET | Freq: Three times a day (TID) | ORAL | 0 refills | Status: DC | PRN

## 2021-10-15 NOTE — Telephone Encounter (Signed)
Patient aware.

## 2021-10-15 NOTE — Telephone Encounter (Signed)
Patient requesting a refill of Tramadol. Please advise.

## 2021-11-01 ENCOUNTER — Telehealth: Payer: Self-pay | Admitting: Physician Assistant

## 2021-11-01 NOTE — Telephone Encounter (Signed)
Patient called and stated she was supposed to get a referral to pain management and she said there was no referral placed and in the mean time she is asking for a refill of Tramadol. Please advise.

## 2021-11-01 NOTE — Telephone Encounter (Signed)
Referral was placed 10/01/21.   I would suggest patient reaching out to [facility] to schedule an appointment. AS, CMA

## 2021-11-01 NOTE — Telephone Encounter (Signed)
Patient aware.

## 2021-11-06 NOTE — Progress Notes (Unsigned)
Complete physical exam   Patient: Monique Wells   DOB: Feb 26, 1981   41 y.o. Female  MRN: [id number] Visit Date: 11/07/2021   No chief complaint on file.  Subjective    Morganna L Senseney is a 41 y.o. female who presents today for a complete physical exam.  She reports consuming a {diet types:17450} diet. {Exercise:19826} She generally feels {well/fairly well/poorly:18703}. She {does/does not:200015} have additional problems to discuss today.   ***  Past Medical History:  Diagnosis Date   Anxiety    Arthritis    spine   Asthma    Chronic lower back pain    HSV (herpes simplex virus) anogenital infection    LGSIL of cervix of undetermined significance 1996   Neuropathy    both legs   Seasonal allergies    SVD (spontaneous vaginal delivery)    x 2   Past Surgical History:  Procedure Laterality Date   CYSTOSCOPY  08/27/2016   Procedure: CYSTOSCOPY;  Surgeon: Dara Lords, MD;  Location: [facility];  Service: Gynecology;;   LAPAROSCOPIC ASSISTED VAGINAL HYSTERECTOMY N/A 08/27/2016   Procedure: LAPAROSCOPIC ASSISTED VAGINAL HYSTERECTOMY;  Surgeon: Dara Lords, MD;  Location: [facility];  Service: Gynecology;  Laterality: N/A;  requests 7:30am OR time  Requests 2 1/2 hours   LEEP  06/20/2016   WISDOM TOOTH EXTRACTION     WRIST GANGLION EXCISION Right    Social History   Socioeconomic History   Marital status: Single    Spouse name: Not on file   Number of children: Not on file   Years of education: Not on file   Highest education level: Not on file  Occupational History   Not on file  Tobacco Use   Smoking status: Every Day    Packs/day: 0.50    Years: 27.00    Total pack years: 13.50    Types: Cigarettes   Smokeless tobacco: Never  Vaping Use   Vaping Use: Never used  Substance and Sexual Activity   Alcohol use: Yes    Comment: Rare   Drug use: No   Sexual activity: Not Currently    Birth control/protection: Condom  Other Topics Concern   Not on file  Social  History Narrative   Not on file   Social Determinants of Health   Financial Resource Strain: Not on file  Food Insecurity: Not on file  Transportation Needs: Not on file  Physical Activity: Not on file  Stress: Not on file  Social Connections: Not on file  Intimate Partner Violence: Not on file     Medications: Outpatient Medications Prior to Visit  Medication Sig   albuterol (PROVENTIL HFA;VENTOLIN HFA) 108 (90 BASE) MCG/ACT inhaler Inhale 2 puffs into the lungs every 6 (six) hours as needed for wheezing or shortness of breath. Asthma; exercise induced    budesonide-formoterol (SYMBICORT) 160-4.5 MCG/ACT inhaler Inhale 2 puffs into the lungs daily as needed (shortness of breath).   clonazePAM (KLONOPIN) 0.5 MG tablet Take 0.5 mg by mouth daily as needed for anxiety.    ibuprofen (ADVIL,MOTRIN) 200 MG tablet Take 800 mg by mouth every 8 (eight) hours as needed for mild pain.    loratadine (CLARITIN) 10 MG tablet Take 10 mg by mouth daily as needed for allergies.   methocarbamol (ROBAXIN) 500 MG tablet Take 1 tablet (500 mg total) by mouth every 8 (eight) hours as needed for muscle spasms.   pregabalin (LYRICA) 150 MG capsule Take 1 capsule (150 mg total) by  mouth 3 (three) times daily.   traMADol (ULTRAM) 50 MG tablet Take 1 tablet (50 mg total) by mouth every 8 (eight) hours as needed for moderate pain.   No facility-administered medications prior to visit.    Review of Systems Review of Systems:  A fourteen system review of systems was performed and found to be positive as per HPI.  Last CBC Lab Results  Component Value Date   WBC 7.0 08/23/2016   HGB 12.5 08/23/2016   HCT 38.0 08/23/2016   MCV 83.9 08/23/2016   MCH 27.6 08/23/2016   RDW 14.0 08/23/2016   PLT 329 [id number]   Last metabolic panel Lab Results  Component Value Date   GLUCOSE 89 04/25/2016   NA 137 04/25/2016   K 3.8 04/25/2016   CL 102 04/25/2016   CO2 25 04/25/2016   BUN 9 04/25/2016    CREATININE 0.73 04/25/2016   CALCIUM 9.0 04/25/2016   PROT 6.7 04/25/2016   ALBUMIN 4.2 04/25/2016   BILITOT 0.2 04/25/2016   ALKPHOS 61 04/25/2016   AST 12 04/25/2016   ALT 9 04/25/2016   Last lipids Lab Results  Component Value Date   CHOL 185 04/25/2016   HDL 42 (L) 04/25/2016   LDLCALC 110 (H) 04/25/2016   TRIG 166 (H) 04/25/2016   CHOLHDL 4.4 04/25/2016   Last hemoglobin A1c No results found for: "HGBA1C" Last thyroid functions No results found for: "TSH", "T3TOTAL", "T4TOTAL", "THYROIDAB" Last vitamin D No results found for: "25OHVITD2", "25OHVITD3", "VD25OH"    Objective    LMP 08/23/2016 (Exact Date)  BP Readings from Last 3 Encounters:  10/01/21 (!) 146/84  09/24/16 118/76  09/10/16 122/80   Wt Readings from Last 3 Encounters:  10/01/21 226 lb (102.5 kg)  08/27/16 224 lb (101.6 kg)  08/23/16 224 lb (101.6 kg)      Physical Exam   General Appearance:     Alert, cooperative, in no acute distress, appears stated age   Head:    Normocephalic, without obvious abnormality, atraumatic  Eyes:    PERRL, conjunctiva/corneas clear, EOM's intact, fundi    benign, both eyes  Ears:    Normal TM's and external ear canals, both ears  Nose:   Nares normal, septum midline, mucosa normal, no drainage    or sinus tenderness  Throat:   Lips, mucosa, and tongue normal; teeth and gums normal  Neck:   Supple, symmetrical, trachea midline, no adenopathy;    thyroid:  no enlargement/tenderness/nodules; no JVD  Back:     Symmetric, no curvature, ROM normal, no CVA tenderness  Lungs:     Clear to auscultation bilaterally, respirations unlabored  Chest Wall:    No tenderness or deformity   Heart:    Normal heart rate. Normal rhythm. No murmurs, rubs, or gallops.   Breast Exam:    {Exam; breast:13139::"deferred"}  Abdomen:     Soft, non-tender, bowel sounds active all four quadrants,    no masses, no organomegaly  Pelvic:    {pelvic exam:16852::"deferred"}  Extremities:   All  extremities are intact. No cyanosis or edema  Pulses:   2+ and symmetric all extremities  Skin:   Skin color, texture, turgor normal, no rashes or lesions  Lymph nodes:   Cervical and supraclavicular nodes normal  Neurologic:   CNII-XII grossly intact.     Last depression screening scores    10/01/2021    2:52 PM  PHQ 2/9 Scores  PHQ - 2 Score 0  PHQ- 9 Score 5  Last fall risk screening     No data to display           No results found for any visits on 11/07/21.  Assessment & Plan    Routine Health Maintenance and Physical Exam  Exercise Activities and Dietary recommendations -Discussed heart healthy diet low in fat and carbohydrates. Recommend moderate exercise 150 mins/wk.  Immunization History  Administered Date(s) Administered   PFIZER(Purple Top)SARS-COV-2 Vaccination 06/22/2021, 08/21/2021    Health Maintenance  Topic Date Due   TETANUS/TDAP  Never done   PAP SMEAR-Modifier  04/26/2019   COVID-19 Vaccine (3 - Pfizer series) 10/16/2021   INFLUENZA VACCINE  10/09/2021   Hepatitis C Screening  Completed   HIV Screening  Completed   HPV VACCINES  Aged Out    Discussed health benefits of physical activity, and encouraged her to engage in regular exercise appropriate for her age and condition.  Problem List Items Addressed This Visit   None    No follow-ups on file.       Mayer Masker, PA-C  [facility] at [facility] [phone] (phone) [phone] (fax)  [facility]

## 2021-11-07 ENCOUNTER — Encounter: Payer: Self-pay | Admitting: Physician Assistant

## 2021-11-07 ENCOUNTER — Ambulatory Visit (INDEPENDENT_AMBULATORY_CARE_PROVIDER_SITE_OTHER): Payer: BC Managed Care – PPO | Admitting: Physician Assistant

## 2021-11-07 ENCOUNTER — Other Ambulatory Visit: Payer: Self-pay | Admitting: Physician Assistant

## 2021-11-07 VITALS — BP 133/78 | HR 96 | Temp 97.7°F | Ht 65.5 in | Wt 228.0 lb

## 2021-11-07 DIAGNOSIS — Z1231 Encounter for screening mammogram for malignant neoplasm of breast: Secondary | ICD-10-CM | POA: Diagnosis not present

## 2021-11-07 DIAGNOSIS — Z0000 Encounter for general adult medical examination without abnormal findings: Secondary | ICD-10-CM

## 2021-11-07 DIAGNOSIS — N644 Mastodynia: Secondary | ICD-10-CM | POA: Diagnosis not present

## 2021-11-07 DIAGNOSIS — G894 Chronic pain syndrome: Secondary | ICD-10-CM | POA: Diagnosis not present

## 2021-11-07 MED ORDER — TRAMADOL HCL 50 MG PO TABS: 50.0000 mg | ORAL_TABLET | Freq: Three times a day (TID) | ORAL | 0 refills | Status: DC | PRN

## 2021-11-07 NOTE — Patient Instructions (Signed)
Preventive Care 40-43 Years Old, Female Preventive care refers to lifestyle choices and visits with your health care provider that can promote health and wellness. Preventive care visits are also called wellness exams. What can I expect for my preventive care visit? Counseling Your health care provider may ask you questions about your: Medical history, including: Past medical problems. Family medical history. Pregnancy history. Current health, including: Menstrual cycle. Method of birth control. Emotional well-being. Home life and relationship well-being. Sexual activity and sexual health. Lifestyle, including: Alcohol, nicotine or tobacco, and drug use. Access to firearms. Diet, exercise, and sleep habits. Work and work environment. Sunscreen use. Safety issues such as seatbelt and bike helmet use. Physical exam Your health care provider will check your: Height and weight. These may be used to calculate your BMI (body mass index). BMI is a measurement that tells if you are at a healthy weight. Waist circumference. This measures the distance around your waistline. This measurement also tells if you are at a healthy weight and may help predict your risk of certain diseases, such as type 2 diabetes and high blood pressure. Heart rate and blood pressure. Body temperature. Skin for abnormal spots. What immunizations do I need?  Vaccines are usually given at various ages, according to a schedule. Your health care provider will recommend vaccines for you based on your age, medical history, and lifestyle or other factors, such as travel or where you work. What tests do I need? Screening Your health care provider may recommend screening tests for certain conditions. This may include: Lipid and cholesterol levels. Diabetes screening. This is done by checking your blood sugar (glucose) after you have not eaten for a while (fasting). Pelvic exam and Pap test. Hepatitis B test. Hepatitis C  test. HIV (human immunodeficiency virus) test. STI (sexually transmitted infection) testing, if you are at risk. Lung cancer screening. Colorectal cancer screening. Mammogram. Talk with your health care provider about when you should start having regular mammograms. This may depend on whether you have a family history of breast cancer. BRCA-related cancer screening. This may be done if you have a family history of breast, ovarian, tubal, or peritoneal cancers. Bone density scan. This is done to screen for osteoporosis. Talk with your health care provider about your test results, treatment options, and if necessary, the need for more tests. Follow these instructions at home: Eating and drinking  Eat a diet that includes fresh fruits and vegetables, whole grains, lean protein, and low-fat dairy products. Take vitamin and mineral supplements as recommended by your health care provider. Do not drink alcohol if: Your health care provider tells you not to drink. You are pregnant, may be pregnant, or are planning to become pregnant. If you drink alcohol: Limit how much you have to 0-1 drink a day. Know how much alcohol is in your drink. In the [place] one drink equals one 12 oz bottle of beer (355 mL), one 5 oz glass of wine (148 mL), or one 1 oz glass of hard liquor (44 mL). Lifestyle Brush your teeth every morning and night with fluoride toothpaste. Floss one time each day. Exercise for at least 30 minutes 5 or more days each week. Do not use any products that contain nicotine or tobacco. These products include cigarettes, chewing tobacco, and vaping devices, such as e-cigarettes. If you need help quitting, ask your health care provider. Do not use drugs. If you are sexually active, practice safe sex. Use a condom or other form of protection to   prevent STIs. If you do not wish to become pregnant, use a form of birth control. If you plan to become pregnant, see your health care provider for a  prepregnancy visit. Take aspirin only as told by your health care provider. Make sure that you understand how much to take and what form to take. Work with your health care provider to find out whether it is safe and beneficial for you to take aspirin daily. Find healthy ways to manage stress, such as: Meditation, yoga, or listening to music. Journaling. Talking to a trusted person. Spending time with friends and family. Minimize exposure to UV radiation to reduce your risk of skin cancer. Safety Always wear your seat belt while driving or riding in a vehicle. Do not drive: If you have been drinking alcohol. Do not ride with someone who has been drinking. When you are tired or distracted. While texting. If you have been using any mind-altering substances or drugs. Wear a helmet and other protective equipment during sports activities. If you have firearms in your house, make sure you follow all gun safety procedures. Seek help if you have been physically or sexually abused. What's next? Visit your health care provider once a year for an annual wellness visit. Ask your health care provider how often you should have your eyes and teeth checked. Stay up to date on all vaccines. This information is not intended to replace advice given to you by your health care provider. Make sure you discuss any questions you have with your health care provider. Document Revised: 08/23/2020 Document Reviewed: 08/23/2020 Elsevier Patient Education  [place].

## 2021-11-08 LAB — COMPREHENSIVE METABOLIC PANEL
ALT: 17 IU/L (ref 0–32)
AST: 14 IU/L (ref 0–40)
Albumin/Globulin Ratio: 1.8 (ref 1.2–2.2)
Albumin: 4.4 g/dL (ref 3.9–4.9)
Alkaline Phosphatase: 86 IU/L (ref 44–121)
BUN/Creatinine Ratio: 13 (ref 9–23)
BUN: 10 mg/dL (ref 6–24)
Bilirubin Total: 0.2 mg/dL (ref 0.0–1.2)
CO2: 26 mmol/L (ref 20–29)
Calcium: 9.4 mg/dL (ref 8.7–10.2)
Chloride: 98 mmol/L (ref 96–106)
Creatinine, Ser: 0.79 mg/dL (ref 0.57–1.00)
Globulin, Total: 2.4 g/dL (ref 1.5–4.5)
Glucose: 114 mg/dL — ABNORMAL HIGH (ref 70–99)
Potassium: 3.6 mmol/L (ref 3.5–5.2)
Sodium: 138 mmol/L (ref 134–144)
Total Protein: 6.8 g/dL (ref 6.0–8.5)
eGFR: 96 mL/min/{1.73_m2} (ref >59)

## 2021-11-08 LAB — LIPID PANEL
Chol/HDL Ratio: 4 ratio (ref 0.0–4.4)
Cholesterol, Total: 213 mg/dL — ABNORMAL HIGH (ref 100–199)
HDL: 53 mg/dL (ref >39)
LDL Chol Calc (NIH): 139 mg/dL — ABNORMAL HIGH (ref 0–99)
Triglycerides: 120 mg/dL (ref 0–149)
VLDL Cholesterol Cal: 21 mg/dL (ref 5–40)

## 2021-11-08 LAB — CBC
Hematocrit: 40.7 % (ref 34.0–46.6)
Hemoglobin: 13.2 g/dL (ref 11.1–15.9)
MCH: 26.3 pg — ABNORMAL LOW (ref 26.6–33.0)
MCHC: 32.4 g/dL (ref 31.5–35.7)
MCV: 81 fL (ref 79–97)
Platelets: 302 10*3/uL (ref 150–450)
RBC: 5.01 x10E6/uL (ref 3.77–5.28)
RDW: 13.6 % (ref 11.7–15.4)
WBC: 7.4 10*3/uL (ref 3.4–10.8)

## 2021-11-08 LAB — HEMOGLOBIN A1C
Est. average glucose Bld gHb Est-mCnc: 131 mg/dL
Hgb A1c MFr Bld: 6.2 % — ABNORMAL HIGH (ref 4.8–5.6)

## 2021-11-08 LAB — TSH: TSH: 5.31 u[IU]/mL — ABNORMAL HIGH (ref 0.450–4.500)

## 2021-11-29 ENCOUNTER — Ambulatory Visit: Payer: BC Managed Care – PPO | Admitting: Skilled Nursing Facility1

## 2022-01-03 ENCOUNTER — Telehealth: Payer: Self-pay

## 2022-01-03 DIAGNOSIS — M5416 Radiculopathy, lumbar region: Secondary | ICD-10-CM

## 2022-01-03 MED ORDER — PREGABALIN 150 MG PO CAPS
150.0000 mg | ORAL_CAPSULE | Freq: Three times a day (TID) | ORAL | 0 refills | Status: DC
  Filled 2022-03-12: qty 90, 30d supply, fill #0

## 2022-01-03 NOTE — Telephone Encounter (Signed)
Pt called to request refill on: pregabalin (LYRICA) 150 MG capsule  Pharmacy: [id number] - [place], [place] 11/07/21 ROV not currently scheduled

## 2022-03-12 ENCOUNTER — Other Ambulatory Visit (HOSPITAL_COMMUNITY): Payer: Self-pay

## 2022-04-15 ENCOUNTER — Other Ambulatory Visit (HOSPITAL_COMMUNITY): Payer: Self-pay

## 2022-04-15 ENCOUNTER — Other Ambulatory Visit: Payer: Self-pay | Admitting: Physician Assistant

## 2022-04-15 DIAGNOSIS — M5416 Radiculopathy, lumbar region: Secondary | ICD-10-CM

## 2022-04-15 MED ORDER — PREGABALIN 150 MG PO CAPS
150.0000 mg | ORAL_CAPSULE | Freq: Three times a day (TID) | ORAL | 0 refills | Status: DC
  Filled 2022-04-15: qty 60, 20d supply, fill #0
  Filled 2022-04-16: qty 210, 70d supply, fill #0

## 2022-04-16 ENCOUNTER — Other Ambulatory Visit (HOSPITAL_COMMUNITY): Payer: Self-pay

## 2022-04-23 ENCOUNTER — Ambulatory Visit: Payer: Commercial Managed Care - PPO | Admitting: Nurse Practitioner

## 2022-07-17 ENCOUNTER — Other Ambulatory Visit: Payer: Self-pay | Admitting: Nurse Practitioner

## 2022-07-17 ENCOUNTER — Other Ambulatory Visit (HOSPITAL_COMMUNITY): Payer: Self-pay

## 2022-07-17 DIAGNOSIS — M5416 Radiculopathy, lumbar region: Secondary | ICD-10-CM

## 2022-07-17 MED ORDER — PREGABALIN 150 MG PO CAPS
150.0000 mg | ORAL_CAPSULE | Freq: Three times a day (TID) | ORAL | 0 refills | Status: DC
  Filled 2022-07-17: qty 6, 2d supply, fill #0
  Filled 2022-07-18: qty 264, 88d supply, fill #0

## 2022-07-18 ENCOUNTER — Other Ambulatory Visit (HOSPITAL_COMMUNITY): Payer: Self-pay

## 2022-07-18 ENCOUNTER — Other Ambulatory Visit: Payer: Self-pay

## 2022-07-19 ENCOUNTER — Other Ambulatory Visit: Payer: Self-pay

## 2022-07-19 ENCOUNTER — Other Ambulatory Visit (HOSPITAL_COMMUNITY): Payer: Self-pay

## 2022-07-19 MED ORDER — PENICILLIN V POTASSIUM 500 MG PO TABS
ORAL_TABLET | ORAL | 0 refills | Status: DC
  Filled 2022-07-19: qty 28, 7d supply, fill #0

## 2022-10-17 ENCOUNTER — Telehealth (INDEPENDENT_AMBULATORY_CARE_PROVIDER_SITE_OTHER): Payer: Commercial Managed Care - PPO | Admitting: Family Medicine

## 2022-10-17 DIAGNOSIS — K439 Ventral hernia without obstruction or gangrene: Secondary | ICD-10-CM

## 2022-10-17 DIAGNOSIS — M5416 Radiculopathy, lumbar region: Secondary | ICD-10-CM

## 2022-10-17 DIAGNOSIS — R200 Anesthesia of skin: Secondary | ICD-10-CM

## 2022-10-17 MED ORDER — PREGABALIN 150 MG PO CAPS: 150.0000 mg | ORAL_CAPSULE | Freq: Three times a day (TID) | ORAL | 0 refills | Status: DC
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
## 2022-10-17 NOTE — Assessment & Plan Note (Signed)
Continue Lyrica for neuropathy.  No questions or concerns at this time.
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
## 2022-10-22 ENCOUNTER — Other Ambulatory Visit (HOSPITAL_COMMUNITY): Payer: Self-pay

## 2022-10-23 DIAGNOSIS — K432 Incisional hernia without obstruction or gangrene: Secondary | ICD-10-CM | POA: Diagnosis not present

## 2022-10-23 DIAGNOSIS — R002 Palpitations: Secondary | ICD-10-CM | POA: Diagnosis not present

## 2022-10-24 ENCOUNTER — Other Ambulatory Visit (HOSPITAL_COMMUNITY): Payer: Self-pay

## 2022-10-24 ENCOUNTER — Other Ambulatory Visit: Payer: Self-pay

## 2022-10-24 ENCOUNTER — Other Ambulatory Visit: Payer: Self-pay | Admitting: Family Medicine

## 2022-10-24 ENCOUNTER — Telehealth: Payer: Self-pay | Admitting: *Deleted

## 2022-10-24 DIAGNOSIS — M5416 Radiculopathy, lumbar region: Secondary | ICD-10-CM

## 2022-10-24 MED ORDER — PREGABALIN 150 MG PO CAPS
150.0000 mg | ORAL_CAPSULE | Freq: Three times a day (TID) | ORAL | 0 refills | Status: DC
Start: 2022-10-24 — End: 2023-01-22
  Filled 2022-10-24: qty 9, 3d supply, fill #0
  Filled 2022-10-24: qty 270, 90d supply, fill #0
  Filled 2022-10-24: qty 261, 87d supply, fill #0

## 2022-10-24 NOTE — Telephone Encounter (Signed)
Pt calling stating the medication that was sent in on 10/17/22 was sent to the wrong pharmacy.  She is out of medication and this should be sent to [facility] outpatient community pharmacy.

## 2022-10-24 NOTE — Telephone Encounter (Signed)
Re sent to [facility].

## 2023-01-16 ENCOUNTER — Encounter: Payer: Self-pay | Admitting: Cardiovascular Disease

## 2023-01-16 NOTE — Progress Notes (Signed)
Cardiology Office Note:  .   Date:  01/17/2023  ID:  Monique Wells, DOB [birth year], MRN [id number] PCP: Sandre Kitty, MD  [facility] Providers Cardiologist:  None    History of Present Illness: .   Monique Wells is a 42 y.o. female with hx of cigarette smoking, palpitations, cervical cancer,  chronic back pain   Wt is 235 Lbs   Has seen cardiology at [facility] about 2 years ago   30 day monitor showed episodes of sinus tachy Rare episodes of nonsustained ventricular tachycardia lasting 8 beats. She had rare premature atrial contractions and rare premature ventricular contractions.   Echo showed normal LV systolic and normal diastolic function  No significant valvular disease   Has some tenderness along her left breast ,   Walks her dogs, not much exercise otherwise  Smokes 1/2 ppd    She needs to have an incisional hernia ( from hysterectomy )  Her incisional hernia will occasionally pop out requiring someone to reduce it .   She has reduced it herself       ROS:    Studies Reviewed: [facility] Kitchen   EKG Interpretation Date/Time:  Friday January 17 2023 14:46:43 EST Ventricular Rate:  81 PR Interval:  172 QRS Duration:  92 QT Interval:  372 QTC Calculation: 432 R Axis:   56  Text Interpretation: Normal sinus rhythm Minimal voltage criteria for LVH, may be normal variant ( Sokolow-Lyon ) Nonspecific ST abnormality No previous ECGs available Confirmed by Kristeen Miss [phone]) on 01/17/2023 3:01:25 PM    EKG Interpretation Date/Time:  Friday January 17 2023 14:46:43 EST Ventricular Rate:  81 PR Interval:  172 QRS Duration:  92 QT Interval:  372 QTC Calculation: 432 R Axis:   56  Text Interpretation: Normal sinus rhythm Minimal voltage criteria for LVH, may be normal variant ( Sokolow-Lyon ) Nonspecific ST abnormality No previous ECGs available Confirmed by Kristeen Miss ([id number]) on 01/17/2023 3:01:25 PM     Risk Assessment/Calculations:     HYPERTENSION  CONTROL Vitals:   01/17/23 0300 01/17/23 1440  BP: 128/85 (!) 152/96    The patient's blood pressure is elevated above target today.  In order to address the patient's elevated BP: Blood pressure will be monitored at home to determine if medication changes need to be made.          Physical Exam:   VS:  BP (!) 152/96   Pulse 81   Ht 5' 5.5" (1.664 m)   Wt 235 lb (106.6 kg)   LMP 08/23/2016 (Exact Date)   BMI 38.51 kg/m    Wt Readings from Last 3 Encounters:  01/17/23 235 lb (106.6 kg)  11/07/21 228 lb (103.4 kg)  10/01/21 226 lb (102.5 kg)    GEN: Well nourished, well developed in no acute distress NECK: No JVD; No carotid bruits CARDIAC: RRR, no murmurs, rubs, gallops RESPIRATORY:  Clear to auscultation without rales, wheezing or rhonchi  ABDOMEN: Soft, non-tender, non-distended EXTREMITIES:  No edema; No deformity   ASSESSMENT AND PLAN: .   1 palpitations: Jenell has had an event monitor performed 2 years ago at [facility].  She was found to have episodes of sinus tachycardia, occasional premature atrial contractions and occasional premature ventricular contractions.  She did not have any serious arrhythmias.  Advised her to work on better diet, exercise, weight loss program.  More exercise will certainly help.  2.  Preoperative evaluation prior to incisional hernia: She has normal left ventricular  systolic function and diastolic function by echo 2 years ago.  She does not have any episodes of cardiac pain.  She is at low risk for her upcoming incisional hernia repair.  Will give her some instructions on Mediterranean diet.  Will see her on an as-needed basis.       Dispo: PRN    Signed, Kristeen Miss, MD

## 2023-01-17 ENCOUNTER — Ambulatory Visit: Payer: Commercial Managed Care - PPO | Attending: Cardiovascular Disease | Admitting: Cardiovascular Disease

## 2023-01-17 ENCOUNTER — Encounter: Payer: Self-pay | Admitting: Cardiovascular Disease

## 2023-01-17 VITALS — BP 152/96 | HR 81 | Ht 65.5 in | Wt 235.0 lb

## 2023-01-17 DIAGNOSIS — R002 Palpitations: Secondary | ICD-10-CM

## 2023-01-17 DIAGNOSIS — F17200 Nicotine dependence, unspecified, uncomplicated: Secondary | ICD-10-CM | POA: Diagnosis not present

## 2023-01-17 NOTE — Patient Instructions (Addendum)
Medication Instructions:   Your physician recommends that you continue on your current medications as directed. Please refer to the Current Medication list given to you today.   *If you need a refill on your cardiac medications before your next appointment, please call your pharmacy*    Follow-Up:  AS NEEDED WITH DR. Elease Hashimoto   Other Instructions  Mediterranean Diet A Mediterranean diet is based on the traditions of countries on the [place] Sea. It focuses on eating more: Fruits and vegetables. Whole grains, beans, nuts, and seeds. Heart-healthy fats. These are fats that are good for your heart. It involves eating less: Dairy. Meat and eggs. Processed foods with added sugar, salt, and fat. This type of diet can help prevent certain conditions. It can also improve outcomes if you have a long-term (chronic) disease, such as kidney or heart disease. What are tips for following this plan? Reading food labels Check packaged foods for: The serving size. For foods such as rice and pasta, the serving size is the amount of cooked product, not dry. The total fat. Avoid foods with saturated fat or trans fat. Added sugars, such as corn syrup. Shopping  Try to have a balanced diet. Buy a variety of foods, such as: Fresh fruits and vegetables. You may be able to get these from local farmers markets. You can also buy them frozen. Grains, beans, nuts, and seeds. Some of these can be bought in bulk. Fresh seafood. Poultry and eggs. Low-fat dairy products. Buy whole ingredients instead of foods that have already been packaged. If you can't get fresh seafood, buy precooked frozen shrimp or canned fish, such as tuna, salmon, or sardines. Stock your pantry so you always have certain foods on hand, such as olive oil, canned tuna, canned tomatoes, rice, pasta, and beans. Cooking Cook foods with extra-virgin olive oil instead of using butter or other vegetable oils. Have meat as a side dish.  Have vegetables or grains as your main dish. This means having meat in small portions or adding small amounts of meat to foods like pasta or stew. Use beans or vegetables instead of meat in common dishes like chili or lasagna. Try out different cooking methods. Try roasting, broiling, steaming, and sauting vegetables. Add frozen vegetables to soups, stews, pasta, or rice. Add nuts or seeds for added healthy fats and plant protein at each meal. You can add these to yogurt, salads, or vegetable dishes. Marinate fish or vegetables using olive oil, lemon juice, garlic, and fresh herbs. Meal planning Plan to eat a vegetarian meal one day each week. Try to work up to two vegetarian meals, if possible. Eat seafood two or more times a week. Have healthy snacks on hand. These may include: Vegetable sticks with hummus. Greek yogurt. Fruit and nut trail mix. Eat balanced meals. These should include: Fruit: 2-3 servings a day. Vegetables: 4-5 servings a day. Low-fat dairy: 2 servings a day. Fish, poultry, or lean meat: 1 serving a day. Beans and legumes: 2 or more servings a week. Nuts and seeds: 1-2 servings a day. Whole grains: 6-8 servings a day. Extra-virgin olive oil: 3-4 servings a day. Limit red meat and sweets to just a few servings a month. Lifestyle  Try to cook and eat meals with your family. Drink enough fluid to keep your pee (urine) pale yellow. Be active every day. This includes: Aerobic exercise, which is exercise that causes your heart to beat faster. Examples include running and swimming. Leisure activities like gardening, walking, or housework.  Get 7-8 hours of sleep each night. Drink red wine if your provider says you can. A glass of wine is 5 oz (150 mL). You may be allowed to have: Up to 1 glass a day if you're female and not pregnant. Up to 2 glasses a day if you're female. What foods should I eat? Fruits Apples. Apricots. Avocado. Berries. Bananas. Cherries. Dates.  Figs. Grapes. Lemons. Melon. Oranges. Peaches. Plums. Pomegranate. Vegetables Artichokes. Beets. Broccoli. Cabbage. Carrots. Eggplant. Green beans. Chard. Kale. Spinach. Onions. Leeks. Peas. Squash. Tomatoes. Peppers. Radishes. Grains Whole-grain pasta. Brown rice. Bulgur wheat. Polenta. Couscous. Whole-wheat bread. Orpah Cobb. Meats and other proteins Beans. Almonds. Sunflower seeds. Pine nuts. Peanuts. Cod. Salmon. Scallops. Shrimp. Tuna. Tilapia. Clams. Oysters. Eggs. Chicken or [place] without skin. Dairy Low-fat milk. Cheese. Greek yogurt. Fats and oils Extra-virgin olive oil. Avocado oil. Grapeseed oil. Beverages Water. Red wine. Herbal tea. Sweets and desserts Greek yogurt with honey. Baked apples. Poached pears. Trail mix. Seasonings and condiments Basil. Cilantro. Coriander. Cumin. Mint. Parsley. Sage. Rosemary. Tarragon. Garlic. Oregano. Thyme. Pepper. Balsamic vinegar. Tahini. Hummus. Tomato sauce. Olives. Mushrooms. The items listed above may not be all the foods and drinks you can have. Talk to a dietitian to learn more. What foods should I limit? This is a list of foods that should be eaten rarely. Fruits Fruit canned in syrup. Vegetables Deep-fried potatoes, like [place] fries. Grains Packaged pasta or rice dishes. Cereal with added sugar. Snacks with added sugar. Meats and other proteins Beef. Pork. Lamb. Chicken or [place] with skin. Hot dogs. Tomasa Blase. Dairy Ice cream. Sour cream. Whole milk. Fats and oils Butter. Canola oil. Vegetable oil. Beef fat (tallow). Lard. Beverages Juice. Sugar-sweetened soft drinks. Beer. Liquor and spirits. Sweets and desserts Cookies. Cakes. Pies. Candy. Seasonings and condiments Mayonnaise. Pre-made sauces and marinades. The items listed above may not be all the foods and drinks you should limit. Talk to a dietitian to learn more. Where to find more information American Heart Association (AHA): heart.org This information is not  intended to replace advice given to you by your health care provider. Make sure you discuss any questions you have with your health care provider. Document Revised: 06/09/2022 Document Reviewed: 06/09/2022 Elsevier Patient Education  2024 [employer].

## 2023-01-22 ENCOUNTER — Telehealth: Payer: Self-pay | Admitting: *Deleted

## 2023-01-22 ENCOUNTER — Other Ambulatory Visit (HOSPITAL_COMMUNITY): Payer: Self-pay

## 2023-01-22 ENCOUNTER — Other Ambulatory Visit: Payer: Self-pay | Admitting: Family Medicine

## 2023-01-22 DIAGNOSIS — M5416 Radiculopathy, lumbar region: Secondary | ICD-10-CM

## 2023-01-22 MED ORDER — PREGABALIN 150 MG PO CAPS
150.0000 mg | ORAL_CAPSULE | Freq: Three times a day (TID) | ORAL | 0 refills | Status: DC
Start: 2023-01-22 — End: 2023-04-25
  Filled 2023-01-22: qty 217, 72d supply, fill #0
  Filled 2023-01-22: qty 53, 18d supply, fill #0

## 2023-01-22 NOTE — Telephone Encounter (Signed)
Prescription Request  01/22/2023  LOV: 10/17/22 Did not see return date for an appointment, informed her that she may need an appointment.   What is the name of the medication or equipment? pregabalin (LYRICA) 150 MG capsule   Have you contacted your pharmacy to request a refill? No   Which pharmacy would you like this sent to?   [facility] [address], [place] [address] Phone: [phone]  Fax: [phone]        Patient notified that their request is being sent to the clinical staff for review and that they should receive a response within 2 business days.   Please advise at Mobile [phone] (mobile)

## 2023-01-22 NOTE — Telephone Encounter (Signed)
Pt informed of below.  

## 2023-01-22 NOTE — Telephone Encounter (Signed)
Refill sent in

## 2023-01-23 ENCOUNTER — Other Ambulatory Visit: Payer: Self-pay

## 2023-01-23 DIAGNOSIS — K432 Incisional hernia without obstruction or gangrene: Secondary | ICD-10-CM | POA: Diagnosis not present

## 2023-01-24 ENCOUNTER — Other Ambulatory Visit (HOSPITAL_COMMUNITY): Payer: Self-pay

## 2023-01-27 ENCOUNTER — Other Ambulatory Visit (HOSPITAL_COMMUNITY): Payer: Self-pay

## 2023-01-28 ENCOUNTER — Other Ambulatory Visit: Payer: Self-pay | Admitting: General Surgery

## 2023-01-28 DIAGNOSIS — K432 Incisional hernia without obstruction or gangrene: Secondary | ICD-10-CM

## 2023-02-18 ENCOUNTER — Other Ambulatory Visit: Payer: Commercial Managed Care - PPO

## 2023-03-19 ENCOUNTER — Other Ambulatory Visit: Payer: Commercial Managed Care - PPO

## 2023-03-20 ENCOUNTER — Other Ambulatory Visit (HOSPITAL_COMMUNITY): Payer: Self-pay

## 2023-03-20 MED ORDER — PENICILLIN V POTASSIUM 500 MG PO TABS
500.0000 mg | ORAL_TABLET | Freq: Four times a day (QID) | ORAL | 0 refills | Status: DC
  Filled 2023-03-20: qty 28, 7d supply, fill #0

## 2023-03-21 ENCOUNTER — Other Ambulatory Visit (HOSPITAL_COMMUNITY): Payer: Self-pay

## 2023-03-21 ENCOUNTER — Other Ambulatory Visit (HOSPITAL_BASED_OUTPATIENT_CLINIC_OR_DEPARTMENT_OTHER): Payer: Self-pay

## 2023-03-21 MED ORDER — OXYCODONE-ACETAMINOPHEN 5-325 MG PO TABS
1.0000 | ORAL_TABLET | Freq: Four times a day (QID) | ORAL | 0 refills | Status: DC | PRN
  Filled 2023-03-21: qty 14, 4d supply, fill #0

## 2023-04-25 ENCOUNTER — Other Ambulatory Visit: Payer: Self-pay | Admitting: Family Medicine

## 2023-04-25 ENCOUNTER — Other Ambulatory Visit (HOSPITAL_COMMUNITY): Payer: Self-pay

## 2023-04-25 ENCOUNTER — Other Ambulatory Visit: Payer: Self-pay

## 2023-04-25 DIAGNOSIS — M5416 Radiculopathy, lumbar region: Secondary | ICD-10-CM

## 2023-04-25 MED ORDER — PREGABALIN 150 MG PO CAPS
150.0000 mg | ORAL_CAPSULE | Freq: Three times a day (TID) | ORAL | 0 refills | Status: DC
Start: 2023-04-25 — End: 2023-07-31
  Filled 2023-04-25: qty 270, 90d supply, fill #0

## 2023-07-31 ENCOUNTER — Other Ambulatory Visit: Payer: Self-pay | Admitting: Family Medicine

## 2023-07-31 ENCOUNTER — Other Ambulatory Visit (HOSPITAL_COMMUNITY): Payer: Self-pay

## 2023-07-31 DIAGNOSIS — M5416 Radiculopathy, lumbar region: Secondary | ICD-10-CM

## 2023-07-31 MED ORDER — PREGABALIN 150 MG PO CAPS
150.0000 mg | ORAL_CAPSULE | Freq: Three times a day (TID) | ORAL | 0 refills | Status: DC
Start: 2023-07-31 — End: 2023-10-31
  Filled 2023-07-31: qty 270, 90d supply, fill #0

## 2023-08-01 ENCOUNTER — Other Ambulatory Visit: Payer: Self-pay

## 2023-08-01 ENCOUNTER — Other Ambulatory Visit (HOSPITAL_COMMUNITY): Payer: Self-pay

## 2023-08-25 ENCOUNTER — Encounter (HOSPITAL_COMMUNITY): Payer: Self-pay

## 2023-08-25 ENCOUNTER — Ambulatory Visit (HOSPITAL_COMMUNITY)
Admission: EM | Admit: 2023-08-25 | Discharge: 2023-08-25 | Disposition: A | Discharge status: Home / Self Care | Attending: Emergency Medicine | Admitting: Emergency Medicine

## 2023-08-25 DIAGNOSIS — S39012A Strain of muscle, fascia and tendon of lower back, initial encounter: Secondary | ICD-10-CM

## 2023-08-25 DIAGNOSIS — G8929 Other chronic pain: Secondary | ICD-10-CM

## 2023-08-25 DIAGNOSIS — M5441 Lumbago with sciatica, right side: Secondary | ICD-10-CM

## 2023-08-25 MED ORDER — DEXAMETHASONE SODIUM PHOSPHATE 10 MG/ML IJ SOLN
INTRAMUSCULAR | Status: AC
  Filled 2023-08-25: qty 1

## 2023-08-25 MED ORDER — BACLOFEN 10 MG PO TABS: 10.0000 mg | ORAL_TABLET | Freq: Three times a day (TID) | ORAL | 0 refills | Status: DC

## 2023-08-25 MED ORDER — DEXAMETHASONE SODIUM PHOSPHATE 10 MG/ML IJ SOLN
10.0000 mg | Freq: Once | INTRAMUSCULAR | Status: AC
  Administered 2023-08-25: 10 mg via INTRAMUSCULAR

## 2023-08-25 MED ORDER — OXYCODONE-ACETAMINOPHEN 5-325 MG PO TABS: 1.0000 | ORAL_TABLET | Freq: Three times a day (TID) | ORAL | 0 refills | Status: DC | PRN

## 2023-08-25 MED ORDER — NAPROXEN 500 MG PO TABS: 500.0000 mg | ORAL_TABLET | Freq: Two times a day (BID) | ORAL | 0 refills | Status: DC

## 2023-08-25 NOTE — ED Triage Notes (Signed)
 Pt states picked up a 24pk of waters last night and twisted. C/o center lower back pain radiating down rt leg. Took ibuprofen and robaxin  with no relief.

## 2023-08-25 NOTE — ED Provider Notes (Signed)
 [facility]    CSN: [id number] Arrival date & time: 08/25/23  5366      History   Chief Complaint Chief Complaint  Patient presents with   Back Pain    HPI Monique Wells is a 43 y.o. female.   Patient presents with acute right sided low back pain that radiates down the back of her right leg that began last night.  Patient states that she was picking up a 36 pack of waters and twisted and felt a pulling sensation in her back.  Patient reports that she has been taking ibuprofen and Robaxin  with minimal relief.  Patient does report history of chronic low back pain with sciatica as well.  Patient denies any recent falls or direct trauma to her back.  The history is provided by the patient and medical records.  Back Pain   Past Medical History:  Diagnosis Date   Anxiety    Arthritis    spine   Asthma    Chronic lower back pain    Hernia cerebri (HCC)    HSV (herpes simplex virus) anogenital infection    LGSIL of cervix of undetermined significance 1996   Neuropathy    both legs   Palpitations    Seasonal allergies    SVD (spontaneous vaginal delivery)    x 2    Patient Active Problem List   Diagnosis Date Noted   Ventral hernia without obstruction or gangrene 10/17/2022   Lumbar radiculopathy, chronic 10/01/2021   Situational anxiety 10/01/2021   Edema 03/22/2020   Dyspnea on exertion 03/22/2020   Palpitations 03/22/2020   Smoking 03/22/2020   CIS (carcinoma in situ of cervix) 08/27/2016   Migraine without aura and without status migrainosus, not intractable 11/07/2015   Hyperglycemia 06/20/2015   Bilateral leg pain 06/20/2015   Asthma 06/20/2015   Numbness of feet 10/11/2013   Sciatica 10/11/2013    Past Surgical History:  Procedure Laterality Date   CYSTOSCOPY  08/27/2016   Procedure: CYSTOSCOPY;  Surgeon: Lacretia Piccolo, MD;  Location: [facility];  Service: Gynecology;;   HERNIA REPAIR     LAPAROSCOPIC ASSISTED VAGINAL HYSTERECTOMY N/A  08/27/2016   Procedure: LAPAROSCOPIC ASSISTED VAGINAL HYSTERECTOMY;  Surgeon: Lacretia Piccolo, MD;  Location: [facility];  Service: Gynecology;  Laterality: N/A;  requests 7:30am OR time  Requests 2 1/2 hours   LEEP  06/20/2016   WISDOM TOOTH EXTRACTION     WRIST GANGLION EXCISION Right     OB History     Gravida  5   Para  2   Term  2   Preterm      AB  2   Living  2      SAB  1   IAB      Ectopic      Multiple      Live Births  2            Home Medications    Prior to Admission medications   Medication Sig Start Date End Date Taking? Authorizing Provider  baclofen (LIORESAL) 10 MG tablet Take 1 tablet (10 mg total) by mouth 3 (three) times daily. 08/25/23  Yes Rosevelt Constable, Malika Demario A, NP  naproxen (NAPROSYN) 500 MG tablet Take 1 tablet (500 mg total) by mouth 2 (two) times daily. 08/25/23  Yes Levora Reas A, NP  oxyCODONE -acetaminophen  (PERCOCET/ROXICET) 5-325 MG tablet Take 1 tablet by mouth every 8 (eight) hours as needed for severe pain (pain score 7-10). 08/25/23  Yes Levora Reas A, NP  pregabalin  (LYRICA ) 150 MG capsule Take 1 capsule (150 mg total) by mouth 3 (three) times daily. 07/31/23   Laneta Pintos, MD    Family History Family History  Problem Relation Age of Onset   Cancer Mother        Brain   Cancer Father        Oral cancer   Hypertension Father    Heart attack Father     Social History Social History   Tobacco Use   Smoking status: Every Day    Current packs/day: 0.50    Average packs/day: 0.5 packs/day for 27.0 years (13.5 ttl pk-yrs)    Types: Cigarettes   Smokeless tobacco: Never  Vaping Use   Vaping status: Never Used  Substance Use Topics   Alcohol use: Yes    Comment: Rare   Drug use: No     Allergies   Morphine and codeine and Vicodin [hydrocodone-acetaminophen ]   Review of Systems Review of Systems  Musculoskeletal:  Positive for back pain.   Per HPI  Physical Exam Triage Vital Signs ED Triage  Vitals  Encounter Vitals Group     BP 08/25/23 1020 (!) 142/97     Girls Systolic BP Percentile --      Girls Diastolic BP Percentile --      Boys Systolic BP Percentile --      Boys Diastolic BP Percentile --      Pulse Rate 08/25/23 1020 96     Resp 08/25/23 1020 16     Temp 08/25/23 1020 98.3 F (36.8 C)     Temp Source 08/25/23 1020 Oral     SpO2 08/25/23 1020 93 %     Weight --      Height --      Head Circumference --      Peak Flow --      Pain Score 08/25/23 1021 6     Pain Loc --      Pain Education --      Exclude from Growth Chart --    No data found.  Updated Vital Signs BP (!) 142/97 (BP Location: Left Arm)   Pulse 96   Temp 98.3 F (36.8 C) (Oral)   Resp 16   LMP 08/23/2016 (Exact Date)   SpO2 93%   Visual Acuity Right Eye Distance:   Left Eye Distance:   Bilateral Distance:    Right Eye Near:   Left Eye Near:    Bilateral Near:     Physical Exam Vitals and nursing note reviewed.  Constitutional:      General: She is awake. She is not in acute distress.    Appearance: Normal appearance. She is well-developed and well-groomed. She is not ill-appearing.   Musculoskeletal:     Cervical back: Normal.     Thoracic back: Normal.     Lumbar back: Spasms and tenderness present. No swelling, edema, deformity or signs of trauma. Decreased range of motion. Positive right straight leg raise test.       Back:   Skin:    General: Skin is warm and dry.   Neurological:     Mental Status: She is alert.   Psychiatric:        Behavior: Behavior is cooperative.      UC Treatments / Results  Labs (all labs ordered are listed, but only abnormal results are displayed) Labs Reviewed - No data to display  EKG   Radiology  No results found.  Procedures Procedures (including critical care time)  Medications Ordered in UC Medications  dexamethasone  (DECADRON ) injection 10 mg (10 mg Intramuscular Given 08/25/23 1045)    Initial Impression /  Assessment and Plan / UC Course  I have reviewed the triage vital signs and the nursing notes.  Pertinent labs & imaging results that were available during my care of the patient were reviewed by me and considered in my medical decision making (see chart for details).     Patient is well-appearing.  Vitals are stable.  Upon assessment tenderness noted to right low back with positive right straight leg raise test.  Decreased range of motion and difficulty walking due to pain noted in clinic.  Given IM Decadron  in clinic.  Prescribed baclofen as needed for muscle pain and spasms.  Prescribed naproxen as needed for additional pain relief.  Prescribed a few tablets of Percocet for acute severe pain.  PDMP reviewed. Recommended Tylenol  as needed for breakthrough pain.  Discussed follow-up and return precautions. Final Clinical Impressions(s) / UC Diagnoses   Final diagnoses:  Chronic bilateral low back pain with right-sided sciatica  Strain of lumbar region, initial encounter     Discharge Instructions      You were given an injection of Decadron  in clinic today which is a steroid to help with inflammation causing your pain. You can take baclofen every 8 hours as needed for muscle pain and spasms.  This sometimes can make people drowsy so I do not recommend driving, working, or drinking alcohol while taking this. You can take naproxen twice daily for additional pain relief. Otherwise I have prescribed 3 tablets of Percocet that you can take every 8 hours as needed for severe pain.  This can make you drowsy so do not drive, work, or drink alcohol while taking this. Otherwise you can take 500 to 1000 mg of Tylenol  every 8 hours as needed for additional pain relief.  Do not exceed 4000 mg in a day. Return here or follow-up with your primary care provider as needed.    ED Prescriptions     Medication Sig Dispense Auth. Provider   baclofen (LIORESAL) 10 MG tablet Take 1 tablet (10 mg total) by  mouth 3 (three) times daily. 30 each Levora Reas A, NP   naproxen (NAPROSYN) 500 MG tablet Take 1 tablet (500 mg total) by mouth 2 (two) times daily. 30 tablet Levora Reas A, NP   oxyCODONE -acetaminophen  (PERCOCET/ROXICET) 5-325 MG tablet Take 1 tablet by mouth every 8 (eight) hours as needed for severe pain (pain score 7-10). 3 tablet Levora Reas A, NP      I have reviewed the PDMP during this encounter.   Levora Reas A, NP 08/25/23 1201

## 2023-08-25 NOTE — Discharge Instructions (Signed)
 You were given an injection of Decadron  in clinic today which is a steroid to help with inflammation causing your pain. You can take baclofen every 8 hours as needed for muscle pain and spasms.  This sometimes can make people drowsy so I do not recommend driving, working, or drinking alcohol while taking this. You can take naproxen twice daily for additional pain relief. Otherwise I have prescribed 3 tablets of Percocet that you can take every 8 hours as needed for severe pain.  This can make you drowsy so do not drive, work, or drink alcohol while taking this. Otherwise you can take 500 to 1000 mg of Tylenol  every 8 hours as needed for additional pain relief.  Do not exceed 4000 mg in a day. Return here or follow-up with your primary care provider as needed.

## 2023-09-09 DIAGNOSIS — H5213 Myopia, bilateral: Secondary | ICD-10-CM | POA: Diagnosis not present

## 2023-10-17 DIAGNOSIS — R059 Cough, unspecified: Secondary | ICD-10-CM | POA: Diagnosis not present

## 2023-10-17 DIAGNOSIS — J019 Acute sinusitis, unspecified: Secondary | ICD-10-CM | POA: Diagnosis not present

## 2023-10-17 DIAGNOSIS — R0981 Nasal congestion: Secondary | ICD-10-CM | POA: Diagnosis not present

## 2023-10-31 ENCOUNTER — Other Ambulatory Visit: Payer: Self-pay | Admitting: Family Medicine

## 2023-10-31 ENCOUNTER — Other Ambulatory Visit (HOSPITAL_COMMUNITY): Payer: Self-pay

## 2023-10-31 DIAGNOSIS — M5416 Radiculopathy, lumbar region: Secondary | ICD-10-CM

## 2023-10-31 MED ORDER — PREGABALIN 150 MG PO CAPS
150.0000 mg | ORAL_CAPSULE | Freq: Three times a day (TID) | ORAL | 0 refills | Status: DC
  Filled 2023-10-31: qty 270, 90d supply, fill #0

## 2023-11-03 ENCOUNTER — Ambulatory Visit (INDEPENDENT_AMBULATORY_CARE_PROVIDER_SITE_OTHER): Admitting: Family Medicine

## 2023-11-03 ENCOUNTER — Other Ambulatory Visit: Payer: Self-pay

## 2023-11-03 ENCOUNTER — Encounter: Payer: Self-pay | Admitting: Family Medicine

## 2023-11-03 ENCOUNTER — Other Ambulatory Visit (HOSPITAL_COMMUNITY): Payer: Self-pay

## 2023-11-03 ENCOUNTER — Other Ambulatory Visit (HOSPITAL_BASED_OUTPATIENT_CLINIC_OR_DEPARTMENT_OTHER): Payer: Self-pay

## 2023-11-03 VITALS — BP 131/86 | HR 85 | Ht 65.5 in | Wt 241.4 lb

## 2023-11-03 DIAGNOSIS — F17200 Nicotine dependence, unspecified, uncomplicated: Secondary | ICD-10-CM | POA: Diagnosis not present

## 2023-11-03 DIAGNOSIS — Z1212 Encounter for screening for malignant neoplasm of rectum: Secondary | ICD-10-CM

## 2023-11-03 DIAGNOSIS — Z1211 Encounter for screening for malignant neoplasm of colon: Secondary | ICD-10-CM

## 2023-11-03 DIAGNOSIS — E669 Obesity, unspecified: Secondary | ICD-10-CM | POA: Diagnosis not present

## 2023-11-03 DIAGNOSIS — N951 Menopausal and female climacteric states: Secondary | ICD-10-CM | POA: Diagnosis not present

## 2023-11-03 DIAGNOSIS — Z1231 Encounter for screening mammogram for malignant neoplasm of breast: Secondary | ICD-10-CM

## 2023-11-03 DIAGNOSIS — J0110 Acute frontal sinusitis, unspecified: Secondary | ICD-10-CM | POA: Diagnosis not present

## 2023-11-03 DIAGNOSIS — Z0000 Encounter for general adult medical examination without abnormal findings: Secondary | ICD-10-CM

## 2023-11-03 MED ORDER — VARENICLINE TARTRATE (STARTER) 0.5 MG X 11 & 1 MG X 42 PO TBPK
ORAL_TABLET | ORAL | 0 refills | Status: AC
  Filled 2023-11-03 – 2024-02-06 (×4): qty 53, 28d supply, fill #0

## 2023-11-03 MED ORDER — FLUOXETINE HCL 10 MG PO CAPS
10.0000 mg | ORAL_CAPSULE | Freq: Every day | ORAL | 2 refills | Status: DC
  Filled 2023-11-03: qty 30, 30d supply, fill #0
  Filled 2023-12-09: qty 30, 30d supply, fill #1
  Filled 2024-01-09 – 2024-02-06 (×2): qty 30, 30d supply, fill #2

## 2023-11-03 MED ORDER — AMOXICILLIN-POT CLAVULANATE 875-125 MG PO TABS
1.0000 | ORAL_TABLET | Freq: Two times a day (BID) | ORAL | 0 refills | Status: AC
Start: 2023-11-03 — End: 2023-11-10
  Filled 2023-11-03: qty 14, 7d supply, fill #0

## 2023-11-03 NOTE — Progress Notes (Unsigned)
 Annual physical  Subjective    Patient ID: Monique Wells, female    DOB: 10/18/1980  Age: 43 y.o. MRN: [id number]  Chief Complaint  Patient presents with   Annual Exam   HPI Monique Wells is a 43 y.o. old female here  for annual exam.   Subjective - Here for physical.  - Requesting Chantix  for smoking cessation. Previously used Chantix  successfully ~10 years ago and quit for one year. Took a lower dose than prescribed (1 mg once daily instead of twice daily) to avoid side effects like nightmares, and it was still effective. Plans to use the same dosing strategy this time. Smokes about half a pack per day, more when drinking, but reports no alcohol use. Needs to quit smoking for hernia surgery.  - Reports menopausal symptoms including night sweats, difficulty concentrating, fatigue, and lack of motivation. Denies feeling depressed. Job and family life are good.  - Complains of weight gain and dissatisfaction with current weight. Reports a significant Dr. Nunzio habit, drinking at least half a 12-pack of cans daily. Previously lost 10 pounds in a month after quitting soda.  - Reports a cough with sinus congestion and pressure for 4 weeks. Symptoms initially improved with a Z-Pak and prednisone but then worsened, particularly sinus pressure. Hears rattling in chest when trying to sleep and has a productive cough, which is felt to be from post-nasal drip. Has seasonal allergies but has not been taking allergy medication.  Medications Lyrica , dose not specified. Took Prozac  as a child.  PMH, PSH, FH, Social Hx PMHx: Migraines. A1C was 6.2 two years ago. Cholesterol was slightly elevated two years ago. TSH was borderline high (5.0) two years ago. PSH: Hysterectomy ~5 years ago, ovaries were retained. Procedure was for pre-cancerous cells. FH: Maternal aunt with breast cancer. Mother with brain cancer. No family history of ovarian, endometrial, or colon cancer. Social Hx: Smokes 0.5 packs per day.  Denies alcohol use. Works as a Radiation protection practitioner.  ROS Constitutional: Reports night sweats, fatigue, lack of motivation. Denies fever. HEENT: Reports sinus congestion and pressure. Denies vision changes. Respiratory: Reports cough. Denies shortness of breath. Psychiatric: Reports difficulty concentrating, brain fog. Denies depressed mood. Endocrine: Reports night sweats. General: Reports weight gain.  Objective Vitals: BP 207/?. Lungs: Clear to auscultation bilaterally.  Assessment and Plan 1. Health Maintenance Here for annual physical. Turns 43 in one week. - Labs ordered: CBC, CMP, lipid panel, TSH, A1C, FSH, LH. - Referral sent for a screening mammogram. Has never had one before. - Discussed colon cancer screening options as she is approaching 43. Patient declined colonoscopy. Provided with a Cologuard test kit and counseled on its use. To be completed at her convenience. - Advised to find a new gynecologist for follow-up post-hysterectomy. Her prior OB/GYN retired. Advised to call the previous practice to inquire about referral needs, but a referral is likely not required by insurance. Will provide one if needed.  2. Tobacco Use Disorder Smokes half a pack per day. Motivated to quit, partly due to a pending incisional hernia repair that requires smoking cessation. Previously successful with Chantix . - Prescribed Chantix  starter pack. - Counseled to start Chantix  first before Prozac  if she prefers not to start two medications simultaneously.  3. Menopausal Symptoms Post-hysterectomy with retained ovaries. Reports night sweats, fatigue, difficulty concentrating, and lack of motivation. - Discussed hormonal vs. non-hormonal treatment options. Hormonal therapy is relatively contraindicated due to smoking status, migraines, and age. Recommended deferring hormonal therapy until after smoking cessation. -  Discussed non-hormonal options including SSRIs. - Prescribed Prozac . - Counseled that it  is acceptable to start Prozac  and Chantix  at the same time, but if side effects occur, it may be difficult to determine the cause.  4. Overweight Dissatisfied with weight. Drinks a significant amount of soda daily. A1C was in the pre-diabetic range two years ago. - Counseled on weight loss strategies, including diet and exercise, with an emphasis on calorie counting (1500-1600 calories/day, not below 1200). - Discussed weight loss medications. Injectables are not covered by her insurance. Phentermine is not recommended at this time due to smoking and blood pressure. Bupropion was discussed but Prozac  is preferred for her menopausal symptoms. - Counseled on the importance of quitting high-sugar beverages like Dr. Nunzio to improve blood sugar control and aid weight loss.  5. Acute Sinusitis Reports 4 weeks of cough, sinus congestion, and pressure. Failed a course of azithromycin and prednisone. - Prescribed Augmentin  875 mg twice daily for 7 days.  Follow-up: - Return to clinic in 6 weeks to follow up on new medications and lab results.   The 10-year ASCVD risk score (Arnett DK, et al., 2019) is: 4.1%  Health Maintenance Due  Topic Date Due   DTaP/Tdap/Td (1 - Tdap) Never done   Pneumococcal Vaccine (1 of 2 - PCV) Never done   Hepatitis B Vaccines 19-59 Average Risk (1 of 3 - 19+ 3-dose series) Never done   HPV VACCINES (1 - 3-dose SCDM series) Never done   Cervical Cancer Screening (HPV/Pap Cotest)  04/25/2021   COVID-19 Vaccine (3 - 2024-25 season) 11/10/2022   INFLUENZA VACCINE  10/10/2023      Objective:     BP (!) 147/88   Pulse 85   Ht 5' 5.5 (1.664 m)   Wt 241 lb 6.4 oz (109.5 kg)   LMP 08/23/2016 (Exact Date)   SpO2 96%   BMI 39.56 kg/m  {Vitals History (Optional):23777}  Physical Exam   No results found for any visits on 11/03/23.      Assessment & Plan:   There are no diagnoses linked to this encounter.   No follow-ups on file.    Toribio [name],  MD

## 2023-11-04 DIAGNOSIS — J019 Acute sinusitis, unspecified: Secondary | ICD-10-CM | POA: Insufficient documentation

## 2023-11-04 DIAGNOSIS — N951 Menopausal and female climacteric states: Secondary | ICD-10-CM | POA: Insufficient documentation

## 2023-11-04 DIAGNOSIS — Z0000 Encounter for general adult medical examination without abnormal findings: Secondary | ICD-10-CM | POA: Insufficient documentation

## 2023-11-04 DIAGNOSIS — E669 Obesity, unspecified: Secondary | ICD-10-CM | POA: Insufficient documentation

## 2023-11-04 NOTE — Assessment & Plan Note (Signed)
 Reports 4 weeks of cough, sinus congestion, and pressure. Failed a course of azithromycin and prednisone. - Prescribed Augmentin  875 mg twice daily for 7 days.

## 2023-11-04 NOTE — Assessment & Plan Note (Signed)
 Dissatisfied with weight. Drinks a significant amount of soda daily. A1C was in the pre-diabetic range two years ago. - Counseled on weight loss strategies, including diet and exercise, with an emphasis on calorie counting (1500-1600 calories/day, not below 1200). - Discussed weight loss medications. Injectables are not covered by her insurance. Phentermine is not recommended at this time due to smoking and blood pressure. Bupropion was discussed but Prozac  is preferred for her menopausal symptoms. - Counseled on the importance of quitting high-sugar beverages like Dr. Nunzio to improve blood sugar control and aid weight loss.

## 2023-11-04 NOTE — Assessment & Plan Note (Signed)
 Here for annual physical. Turns 43 in one week. - Labs ordered: CBC, CMP, lipid panel, TSH, A1C, FSH, LH. - Referral sent for a screening mammogram. Has never had one before. - Discussed colon cancer screening options as she is approaching 43. Patient declined colonoscopy. Provided with a Cologuard test kit and counseled on its use. To be completed at her convenience. - Advised to find a new gynecologist for follow-up post-hysterectomy. Her prior OB/GYN retired. Advised to call the previous practice to inquire about referral needs, but a referral is likely not required by insurance. Will provide one if needed.

## 2023-11-04 NOTE — Assessment & Plan Note (Signed)
 Post-hysterectomy with retained ovaries. Reports night sweats, fatigue, difficulty concentrating, and lack of motivation. - Discussed hormonal vs. non-hormonal treatment options. Hormonal therapy is relatively contraindicated due to smoking status, migraines, and age. Recommended deferring hormonal therapy until after smoking cessation. - Discussed non-hormonal options including SSRIs. - Prescribed Prozac .

## 2023-11-04 NOTE — Assessment & Plan Note (Signed)
 Smokes half a pack per day. Motivated to quit, partly due to a pending incisional hernia repair that requires smoking cessation. Previously successful with Chantix . - Prescribed Chantix  starter pack. - Counseled to start Chantix  first before Prozac  if she prefers not to start two medications simultaneously.

## 2023-11-06 ENCOUNTER — Other Ambulatory Visit

## 2023-11-13 ENCOUNTER — Other Ambulatory Visit (HOSPITAL_COMMUNITY): Payer: Self-pay

## 2023-11-13 ENCOUNTER — Other Ambulatory Visit

## 2023-11-13 DIAGNOSIS — N951 Menopausal and female climacteric states: Secondary | ICD-10-CM | POA: Diagnosis not present

## 2023-11-13 DIAGNOSIS — E669 Obesity, unspecified: Secondary | ICD-10-CM | POA: Diagnosis not present

## 2023-11-13 DIAGNOSIS — F17200 Nicotine dependence, unspecified, uncomplicated: Secondary | ICD-10-CM

## 2023-11-14 ENCOUNTER — Ambulatory Visit: Payer: Self-pay

## 2023-11-14 LAB — LIPID PANEL
Chol/HDL Ratio: 5.1 ratio — ABNORMAL HIGH (ref 0.0–4.4)
Cholesterol, Total: 226 mg/dL — ABNORMAL HIGH (ref 100–199)
HDL: 44 mg/dL (ref >39)
LDL Chol Calc (NIH): 157 mg/dL — ABNORMAL HIGH (ref 0–99)
Triglycerides: 137 mg/dL (ref 0–149)
VLDL Cholesterol Cal: 25 mg/dL (ref 5–40)

## 2023-11-14 LAB — COMPREHENSIVE METABOLIC PANEL WITH GFR
ALT: 15 IU/L (ref 0–32)
AST: 16 IU/L (ref 0–40)
Albumin: 4.2 g/dL (ref 3.9–4.9)
Alkaline Phosphatase: 79 IU/L (ref 44–121)
BUN/Creatinine Ratio: 12 (ref 9–23)
BUN: 10 mg/dL (ref 6–24)
Bilirubin Total: 0.3 mg/dL (ref 0.0–1.2)
CO2: 25 mmol/L (ref 20–29)
Calcium: 9.3 mg/dL (ref 8.7–10.2)
Chloride: 104 mmol/L (ref 96–106)
Creatinine, Ser: 0.85 mg/dL (ref 0.57–1.00)
Globulin, Total: 2.3 g/dL (ref 1.5–4.5)
Glucose: 106 mg/dL — ABNORMAL HIGH (ref 70–99)
Potassium: 4.3 mmol/L (ref 3.5–5.2)
Sodium: 143 mmol/L (ref 134–144)
Total Protein: 6.5 g/dL (ref 6.0–8.5)
eGFR: 86 mL/min/1.73 (ref >59)

## 2023-11-14 LAB — HEMOGLOBIN A1C
Est. average glucose Bld gHb Est-mCnc: 134 mg/dL
Hgb A1c MFr Bld: 6.3 % — ABNORMAL HIGH (ref 4.8–5.6)

## 2023-11-14 LAB — CBC WITH DIFFERENTIAL/PLATELET
Basophils Absolute: 0.1 x10E3/uL (ref 0.0–0.2)
Basos: 1 %
EOS (ABSOLUTE): 0.3 x10E3/uL (ref 0.0–0.4)
Eos: 4 %
Hematocrit: 41.9 % (ref 34.0–46.6)
Hemoglobin: 13.6 g/dL (ref 11.1–15.9)
Immature Grans (Abs): 0 x10E3/uL (ref 0.0–0.1)
Immature Granulocytes: 0 %
Lymphocytes Absolute: 2.9 x10E3/uL (ref 0.7–3.1)
Lymphs: 41 %
MCH: 26.9 pg (ref 26.6–33.0)
MCHC: 32.5 g/dL (ref 31.5–35.7)
MCV: 83 fL (ref 79–97)
Monocytes Absolute: 0.4 x10E3/uL (ref 0.1–0.9)
Monocytes: 6 %
Neutrophils Absolute: 3.4 x10E3/uL (ref 1.4–7.0)
Neutrophils: 48 %
Platelets: 307 x10E3/uL (ref 150–450)
RBC: 5.06 x10E6/uL (ref 3.77–5.28)
RDW: 13.7 % (ref 11.7–15.4)
WBC: 7.1 x10E3/uL (ref 3.4–10.8)

## 2023-11-14 LAB — FSH/LH
FSH: 54.8 m[IU]/mL
LH: 14.9 m[IU]/mL

## 2023-11-14 LAB — TSH: TSH: 4.81 u[IU]/mL — ABNORMAL HIGH (ref 0.450–4.500)

## 2023-12-13 ENCOUNTER — Other Ambulatory Visit (HOSPITAL_COMMUNITY): Payer: Self-pay

## 2023-12-23 ENCOUNTER — Other Ambulatory Visit (HOSPITAL_COMMUNITY): Payer: Self-pay

## 2024-01-01 ENCOUNTER — Ambulatory Visit: Admitting: Family Medicine

## 2024-01-09 ENCOUNTER — Other Ambulatory Visit (HOSPITAL_COMMUNITY): Payer: Self-pay

## 2024-01-09 ENCOUNTER — Other Ambulatory Visit: Payer: Self-pay

## 2024-01-19 ENCOUNTER — Other Ambulatory Visit (HOSPITAL_COMMUNITY): Payer: Self-pay

## 2024-02-06 ENCOUNTER — Other Ambulatory Visit (HOSPITAL_COMMUNITY): Payer: Self-pay

## 2024-02-06 ENCOUNTER — Other Ambulatory Visit: Payer: Self-pay

## 2024-02-06 ENCOUNTER — Other Ambulatory Visit: Payer: Self-pay | Admitting: Family Medicine

## 2024-02-06 DIAGNOSIS — M5416 Radiculopathy, lumbar region: Secondary | ICD-10-CM

## 2024-02-09 ENCOUNTER — Other Ambulatory Visit (HOSPITAL_COMMUNITY): Payer: Self-pay

## 2024-02-09 ENCOUNTER — Other Ambulatory Visit: Payer: Self-pay

## 2024-02-09 MED ORDER — PREGABALIN 150 MG PO CAPS
150.0000 mg | ORAL_CAPSULE | Freq: Three times a day (TID) | ORAL | 1 refills | Status: AC
  Filled 2024-02-09: qty 270, 90d supply, fill #0

## 2024-02-10 ENCOUNTER — Other Ambulatory Visit (HOSPITAL_COMMUNITY): Payer: Self-pay

## 2024-03-16 ENCOUNTER — Encounter (HOSPITAL_COMMUNITY): Payer: Self-pay

## 2024-03-16 ENCOUNTER — Other Ambulatory Visit: Payer: Self-pay | Admitting: Family Medicine

## 2024-03-16 ENCOUNTER — Other Ambulatory Visit (HOSPITAL_COMMUNITY): Payer: Self-pay

## 2024-03-16 MED ORDER — FLUOXETINE HCL 10 MG PO CAPS
10.0000 mg | ORAL_CAPSULE | Freq: Every day | ORAL | 2 refills | Status: AC
  Filled 2024-03-16: qty 90, 90d supply, fill #0
